# Patient Record
Sex: Male | Born: 1940 | Race: White | Hispanic: No | Smoking: Current every day smoker
Health system: Southern US, Community
[De-identification: ages and names within clinical notes are randomized; demographics above are authoritative.]

## PROBLEM LIST (undated history)

## (undated) DIAGNOSIS — F0280 Dementia in other diseases classified elsewhere without behavioral disturbance: Secondary | ICD-10-CM

## (undated) DIAGNOSIS — I509 Heart failure, unspecified: Secondary | ICD-10-CM

## (undated) DIAGNOSIS — I10 Essential (primary) hypertension: Secondary | ICD-10-CM

## (undated) DIAGNOSIS — G309 Alzheimer's disease, unspecified: Secondary | ICD-10-CM

## (undated) HISTORY — PX: FEMUR SURGERY: SHX943

## (undated) HISTORY — PX: REPLACEMENT TOTAL KNEE: SUR1224

---

## 2006-04-24 ENCOUNTER — Ambulatory Visit (HOSPITAL_BASED_OUTPATIENT_CLINIC_OR_DEPARTMENT_OTHER): Admission: RE | Admit: 2006-04-24 | Discharge: 2006-04-24 | Payer: Self-pay | Admitting: Orthopedic Surgery

## 2009-11-23 ENCOUNTER — Encounter
Admission: RE | Admit: 2009-11-23 | Discharge: 2010-02-08 | Payer: Self-pay | Source: Home / Self Care | Attending: Specialist | Admitting: Specialist

## 2010-06-29 NOTE — Op Note (Signed)
NAMEJOHN, WILLIAMSEN              ACCOUNT NO.:  [id number]   MEDICAL RECORD NO.:  [id number]          PATIENT TYPE:  AMB   LOCATION:  DSC                          FACILITY:  [facility]   PHYSICIAN:  Katy Fitch. Sypher, M.D. 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   POSTOPERATIVE DIAGNOSIS:  Entrapment neuropathy median nerve right  carpal tunnel.   OPERATION:  Release right transverse carpal ligament.   SURGEON:  Katy Fitch. Sypher, M.D.   ASSISTANT:  Marveen Reeks. Dasnoit, P.A.-C.   ANESTHESIA:  General by LMA.   SUPERVISING ANESTHESIOLOGIST:  Janetta Hora. Gelene Mink, M.D.   INDICATIONS:  Brian Frazier is a 70 year old gentleman referred for  evaluation and management of hand numbness.  Clinical examination  revealed signs of severe right carpal tunnel syndrome.  He had had  numbness and pain for several years.  He was referred for  electrodiagnostic studies that documented severe right carpal tunnel  syndrome.  Due to a failure to respond to nonoperative measures, he is  brought to the operating room at this time for release of his right  transverse carpal ligament.   PROCEDURE:  Brian Frazier was brought to the operating room and placed in  the supine position on the operating table.  Following the induction of  general anesthesia by LMA technique, the right arm was prepped with  Betadine soap solution and sterilely draped.  A pneumatic tourniquet was  applied to the proximal right brachium.  Following exsanguination of the  right arm with an Esmarch bandage, the arterial tourniquet was inflated  to 250 mmHg.   The procedure then commenced with a short incision in line with the ring  finger in the palm.  The subcutaneous tissues were carefully divided  revealing the palmar fascia.  This was split longitudinally to  reveal  the common sensory branch of the median nerve.  These were followed back  to the transverse carpal ligament which was gently isolated from the  median nerve with a Insurance risk surveyor.  The ligament was then released  along its ulnar border with scissors extending into the distal forearm.  This widely opened the carpal canal.  No mass or other predicaments were  noted.  Bleeding points along the margin of the released ligament were  cauterized with bipolar current  followed by repair of the skin with intradermal 3-0 Prolene suture.  A  compressive dressing was applied with a volar plaster splint maintaining  the wrist in 5 degrees of dorsiflexion.  There were no apparent  complications.   [name] tolerated the surgery and anesthesia well.  He was  transferred to the recovery room with stable vital signs.      Katy Fitch Sypher, M.D.  Electronically Signed     RVS/MEDQ  D:  04/24/2006  T:  04/25/2006  Job:  [id number]

## 2012-04-23 DIAGNOSIS — R011 Cardiac murmur, unspecified: Secondary | ICD-10-CM

## 2013-10-19 ENCOUNTER — Emergency Department (HOSPITAL_COMMUNITY): Payer: Medicare Other

## 2013-10-19 ENCOUNTER — Encounter (HOSPITAL_COMMUNITY): Payer: Self-pay | Admitting: Emergency Medicine

## 2013-10-19 ENCOUNTER — Emergency Department (HOSPITAL_COMMUNITY)
Admission: EM | Admit: 2013-10-19 | Discharge: 2013-10-19 | Disposition: A | Discharge status: Home / Self Care | Payer: Medicare Other | Attending: Emergency Medicine | Admitting: Emergency Medicine

## 2013-10-19 DIAGNOSIS — M549 Dorsalgia, unspecified: Secondary | ICD-10-CM | POA: Diagnosis present

## 2013-10-19 DIAGNOSIS — I10 Essential (primary) hypertension: Secondary | ICD-10-CM | POA: Insufficient documentation

## 2013-10-19 DIAGNOSIS — I509 Heart failure, unspecified: Secondary | ICD-10-CM | POA: Insufficient documentation

## 2013-10-19 DIAGNOSIS — F0280 Dementia in other diseases classified elsewhere without behavioral disturbance: Secondary | ICD-10-CM | POA: Insufficient documentation

## 2013-10-19 DIAGNOSIS — Z79899 Other long term (current) drug therapy: Secondary | ICD-10-CM | POA: Diagnosis not present

## 2013-10-19 DIAGNOSIS — G309 Alzheimer's disease, unspecified: Secondary | ICD-10-CM | POA: Insufficient documentation

## 2013-10-19 DIAGNOSIS — M5430 Sciatica, unspecified side: Secondary | ICD-10-CM | POA: Insufficient documentation

## 2013-10-19 DIAGNOSIS — Z791 Long term (current) use of non-steroidal anti-inflammatories (NSAID): Secondary | ICD-10-CM | POA: Diagnosis not present

## 2013-10-19 DIAGNOSIS — F17200 Nicotine dependence, unspecified, uncomplicated: Secondary | ICD-10-CM | POA: Diagnosis not present

## 2013-10-19 DIAGNOSIS — M79609 Pain in unspecified limb: Secondary | ICD-10-CM | POA: Insufficient documentation

## 2013-10-19 DIAGNOSIS — M5441 Lumbago with sciatica, right side: Secondary | ICD-10-CM

## 2013-10-19 HISTORY — DX: Heart failure, unspecified: I50.9

## 2013-10-19 HISTORY — DX: Alzheimer's disease, unspecified: G30.9

## 2013-10-19 HISTORY — DX: Essential (primary) hypertension: I10

## 2013-10-19 HISTORY — DX: Dementia in other diseases classified elsewhere, unspecified severity, without behavioral disturbance, psychotic disturbance, mood disturbance, and anxiety: F02.80

## 2013-10-19 MED ORDER — PREDNISONE 20 MG PO TABS: 20.0000 mg | ORAL_TABLET | Freq: Two times a day (BID) | ORAL | Status: DC

## 2013-10-19 NOTE — ED Provider Notes (Signed)
CSN: [id number]     Arrival date & time 10/19/13  4098 History   First MD Initiated Contact with Patient 10/19/13 1152     Chief Complaint  Patient presents with  . Back Pain  . Leg Pain     (Consider location/radiation/quality/duration/timing/severity/associated sxs/prior Treatment) Patient is a 73 y.o. male presenting with back pain and leg pain. The history is provided by the patient.  Back Pain Associated symptoms: leg pain   Leg Pain Associated symptoms: back pain    Patient presents to the emergency department today because he is having back pain radiating to his right leg. He called his primary care doctor this morning, and was advised to come to the ED, because, they thought he needed an MRI, and they thought that we had an Open MRI. The patient had seen in urgent care, 4 days ago, and was advised to continue his regular medication. He has history of arthritis, and chronic pain in both legs. He has been taking his usual pain medication, with relief. He denies urinary incontinence, urinary frequency, difficulty urinating, fecal incontinence, or new paresthesia. He has not had a fever, chills, nausea or vomiting. No other known modifying factors.  Past Medical History  Diagnosis Date  . Hypertension   . CHF (congestive heart failure)   . Alzheimer disease    Past Surgical History  Procedure Laterality Date  . Replacement total knee      right  . Femur surgery      metal rod right leg   No family history on file. History  Substance Use Topics  . Smoking status: Current Every Day Smoker  . Smokeless tobacco: Not on file  . Alcohol Use: Yes    Review of Systems  Musculoskeletal: Positive for back pain.  All other systems reviewed and are negative.     Allergies  Aspirin  Home Medications   Prior to Admission medications   Medication Sig Start Date End Date Taking? Authorizing Provider  atorvastatin (LIPITOR) 10 MG tablet Take 10 mg by mouth at bedtime. 09/28/13   Yes Historical Provider, MD  diazepam (VALIUM) 10 MG tablet Take 10 mg by mouth 2 (two) times daily as needed for anxiety or sleep.  10/07/13  Yes Historical Provider, MD  diclofenac (VOLTAREN) 75 MG EC tablet Take 75 mg by mouth 2 (two) times daily.   Yes Historical Provider, MD  diclofenac sodium (VOLTAREN) 1 % GEL Apply 2 g topically 4 (four) times daily as needed (for knee pain). Do not use if taking diclofenac tablets   Yes Historical Provider, MD  lisinopril-hydrochlorothiazide (PRINZIDE,ZESTORETIC) 10-12.5 MG per tablet Take 1 tablet by mouth at bedtime. 08/23/13  Yes Historical Provider, MD  Oxycodone HCl 20 MG TABS Take 20 mg by mouth every 4 (four) hours as needed (for pain).  09/28/13  Yes Historical Provider, MD  predniSONE (DELTASONE) 20 MG tablet Take 1 tablet (20 mg total) by mouth 2 (two) times daily. 10/19/13   Flint Melter, MD   BP 130/68  Pulse 59  Temp(Src) 98.4 F (36.9 C) (Oral)  Resp 18  SpO2 97% Physical Exam  Nursing note and vitals reviewed. Constitutional: He is oriented to person, place, and time. He appears well-developed and well-nourished.  HENT:  Head: Normocephalic and atraumatic.  Right Ear: External ear normal.  Left Ear: External ear normal.  Eyes: Conjunctivae and EOM are normal. Pupils are equal, round, and reactive to light.  Neck: Normal range of motion and phonation normal. Neck  supple.  Cardiovascular: Normal rate, regular rhythm, normal heart sounds and intact distal pulses.   Pulmonary/Chest: Effort normal and breath sounds normal. He exhibits no bony tenderness.  Abdominal: Soft. There is no tenderness.  Musculoskeletal: Normal range of motion.  Mild lumbar tenderness to palpation. He is able to sit up in the stretcher and hold that position.   Neurological: He is alert and oriented to person, place, and time. No cranial nerve deficit or sensory deficit. He exhibits normal muscle tone. Coordination normal.  Skin: Skin is warm, dry and intact.   Psychiatric: He has a normal mood and affect. His behavior is normal. Judgment and thought content normal.    ED Course  Procedures (including critical care time)  Medications - No data to display  Patient Vitals for the past 24 hrs:  BP Temp Temp src Pulse Resp SpO2  10/19/13 1415 130/68 mmHg - - 59 - 97 %  10/19/13 1403 124/69 mmHg - - 62 18 97 %  10/19/13 1322 139/79 mmHg - - 56 18 97 %  10/19/13 1300 107/66 mmHg - - 57 - 97 %  10/19/13 1230 117/98 mmHg - - 61 - 96 %  10/19/13 1215 102/60 mmHg - - 58 - 96 %  10/19/13 1019 104/64 mmHg 98.4 F (36.9 C) Oral 64 18 95 %    2:58 PM Reevaluation with update and discussion. After initial assessment and treatment, an updated evaluation reveals he is comfortable. Findings discussed with patient and family member, all questions answered. Jamari Moten L   Labs Review Labs Reviewed - No data to display  Imaging Review Dg Lumbar Spine Complete  10/19/2013   CLINICAL DATA:  Back pain without injury  EXAM: LUMBAR SPINE - COMPLETE 4+ VIEW  COMPARISON:  None.  FINDINGS: Five lumbar type vertebral bodies are well visualized. The fifth lumbar vertebra is partially sacralized. Vertebral body height is well maintained. Mild disc space narrowing is noted at L4-5 and L5-S1. Anterior osteophytes are noted particularly at L3-4. Facet hypertrophic changes are noted at all levels.  IMPRESSION: Degenerative change without acute abnormality.   Electronically Signed   By: Alcide Clever M.D.   On: 10/19/2013 13:59     EKG Interpretation None      MDM   Final diagnoses:  Right-sided low back pain with right-sided sciatica    Nonspecific low back pain. Doubt cauda equina syndrome, significant herniated disc or lumbar radiculopathy. His exam is most consistent with sciatica and mild degenerative joint disease.  Nursing Notes Reviewed/ Care Coordinated Applicable Imaging Reviewed Interpretation of Laboratory Data incorporated into ED treatment  The  patient appears reasonably screened and/or stabilized for discharge and I doubt any other medical condition or other [facility] requiring further screening, evaluation, or treatment in the ED at this time prior to discharge.  Plan: Home Medications- Prednisone; Home Treatments- rest, heat; return here if the recommended treatment, does not improve the symptoms; Recommended follow up- PCP prn    Flint Melter, MD 10/19/13 1501

## 2013-10-19 NOTE — ED Notes (Signed)
Room cleaned prior to patient signing for papers. Patient verbalized understanding of discharge instructions.

## 2013-10-19 NOTE — Discharge Instructions (Signed)
Use heat on the sore area 3 or 4 times a day. Do not take the diclofenac tablet, while you were taking prednisone.      Back Pain, Adult Back pain is very common. The pain often gets better over time. The cause of back pain is usually not dangerous. Most people can learn to manage their back pain on their own.  HOME CARE   Stay active. Start with short walks on flat ground if you can. Try to walk farther each day.  Do not sit, drive, or stand in one place for more than 30 minutes. Do not stay in bed.  Do not avoid exercise or work. Activity can help your back heal faster.  Be careful when you bend or lift an object. Bend at your knees, keep the object close to you, and do not twist.  Sleep on a firm mattress. Lie on your side, and bend your knees. If you lie on your back, put a pillow under your knees.  Only take medicines as told by your doctor.  Put ice on the injured area.  Put ice in a plastic bag.  Place a towel between your skin and the bag.  Leave the ice on for 15-20 minutes, 03-04 times a day for the first 2 to 3 days. After that, you can switch between ice and heat packs.  Ask your doctor about back exercises or massage.  Avoid feeling anxious or stressed. Find good ways to deal with stress, such as exercise. GET HELP RIGHT AWAY IF:   Your pain does not go away with rest or medicine.  Your pain does not go away in 1 week.  You have new problems.  You do not feel well.  The pain spreads into your legs.  You cannot control when you poop (bowel movement) or pee (urinate).  Your arms or legs feel weak or lose feeling (numbness).  You feel sick to your stomach (nauseous) or throw up (vomit).  You have belly (abdominal) pain.  You feel like you may pass out (faint). MAKE SURE YOU:   Understand these instructions.  Will watch your condition.  Will get help right away if you are not doing well or get worse. Document Released: 07/17/2007 Document  Revised: 04/22/2011 Document Reviewed: 06/01/2013 [facility] Patient Information 2015 [place], [place]. This information is not intended to replace advice given to you by your health care provider. Make sure you discuss any questions you have with your health care provider.

## 2013-10-19 NOTE — ED Notes (Signed)
Pt was sent here from his MD office for need of possible MRI.  Pt was seen at an urgent care for pain in his right lower back that radiates down right leg.  Pt states pain is not any better, but is currently pain free from taking oxycodone.  Pt denies any abdominal pain

## 2017-05-27 ENCOUNTER — Encounter (INDEPENDENT_AMBULATORY_CARE_PROVIDER_SITE_OTHER): Payer: Self-pay | Admitting: *Deleted

## 2017-06-05 ENCOUNTER — Encounter (INDEPENDENT_AMBULATORY_CARE_PROVIDER_SITE_OTHER): Payer: Self-pay | Admitting: *Deleted

## 2020-06-29 ENCOUNTER — Encounter: Payer: Self-pay | Admitting: Neurology

## 2020-08-17 NOTE — Progress Notes (Signed)
NEUROLOGY CONSULTATION NOTE  [name] MRN: [id number] DOB: May 27, 1940  Referring provider: Halford Decamp IV, FNP Primary care provider: Samuel Jester, DO  Reason for consult:  migraine  Assessment/Plan:   Chronic daily headaches, may be cervicogenic component, unclear if actually migraine - no history and does not meet criteria - may be medication-overuse component.  1.MRI brain with and without contrast 2.Start gabapentin 100mg  twice daily.  We can increase to 300mg  twice daily in 4 weeks if needed. 3.Limit use of pain relievers to no more than 2 days out of week to prevent risk of rebound or medication-overuse headache. 4.  Caffeine cessation 5. Follow up 6 months.   Subjective:  Brian Frazier is a 80 year old male with CHF, HTN and Alzheimer's disease who presents for migraine.  History supplemented by referring provider's note.  Onset:  a little over a year ago Location:  started in right ear and now top of head and bilateral/left sided and down neck bilaterally Quality:  throbbing, stabbing in necks Intensity:  severe.   Aura:  no Prodrome:  no Associated symptoms:  Photophobia.  No nausea, vomiting, phonophobia, visual disturbance.   Duration:  all day Frequency:  daily Frequency of abortive medication: indomethacin daily (arthritis), ibuprofen at least 2 days a week. Triggers:  unknown Relieving factors:  Nurtec Activity:  aggravates  Current NSAIDS/analgesics:  ibuprofen, indomethacin 50mg  BID Current triptans:  none Current ergotamine:  none Current anti-emetic:  none Current muscle relaxants:  none Current Antihypertensive medications:  lisinopril-HCTZ Current Antidepressant medications:  Lexapro 10mg  daily Current Anticonvulsant medications:  none Current anti-CGRP:  Nurtec  Past NSAIDS/analgesics:  none Past abortive triptans:  none Past abortive ergotamine:  none Past muscle relaxants:  none Past anti-emetic:  none Past  antihypertensive medications:  none Past antidepressant medications:  none Past anticonvulsant medications:  gabapentin 100mg  BID (not for years) Past anti-CGRP:  none Past vitamins/Herbal/Supplements:  none Past antihistamines/decongestants:  none Other past therapies:  none  Caffeine:  1 to 2 cups coffee daily Family history of headache:  history of brain cancer     PAST MEDICAL HISTORY: Past Medical History:  Diagnosis Date   Alzheimer disease (HCC)    CHF (congestive heart failure) (HCC)    Hypertension     PAST SURGICAL HISTORY: Past Surgical History:  Procedure Laterality Date   FEMUR SURGERY     metal rod right leg   REPLACEMENT TOTAL KNEE     right    MEDICATIONS: Current Outpatient Medications on File Prior to Visit  Medication Sig Dispense Refill   atorvastatin (LIPITOR) 10 MG tablet Take 10 mg by mouth at bedtime.     diazepam (VALIUM) 10 MG tablet Take 10 mg by mouth 2 (two) times daily as needed for anxiety or sleep.      diclofenac (VOLTAREN) 75 MG EC tablet Take 75 mg by mouth 2 (two) times daily.     diclofenac sodium (VOLTAREN) 1 % GEL Apply 2 g topically 4 (four) times daily as needed (for knee pain). Do not use if taking diclofenac tablets     lisinopril-hydrochlorothiazide (PRINZIDE,ZESTORETIC) 10-12.5 MG per tablet Take 1 tablet by mouth at bedtime.     Oxycodone HCl 20 MG TABS Take 20 mg by mouth every 4 (four) hours as needed (for pain).      predniSONE (DELTASONE) 20 MG tablet Take 1 tablet (20 mg total) by mouth 2 (two) times daily. 10 tablet 0   No current facility-administered medications  on file prior to visit.    ALLERGIES: Allergies  Allergen Reactions   Aspirin Anaphylaxis, Hives and Swelling    FAMILY HISTORY: No family history on file.  Objective:  Blood pressure 117/75, pulse 72, height 5' 10.5" (1.791 m), weight 252 lb 4 oz (114.4 kg), SpO2 92 %. General: No acute distress.  Patient appears well-groomed.   Head:   Normocephalic/atraumatic Eyes:  fundi examined but not visualized Neck: supple, bilateral paraspinal tenderness, full range of motion Back: No paraspinal tenderness Heart: regular rate and rhythm Lungs: Clear to auscultation bilaterally. Vascular: No carotid bruits. Neurological Exam: Mental status: alert and oriented to person, place, and time, recent and remote memory intact, fund of knowledge intact, attention and concentration intact, speech fluent and not dysarthric, language intact. Cranial nerves: CN I: not tested CN II: pupils equal, round and reactive to light, visual fields intact CN III, IV, VI:  full range of motion, no nystagmus, no ptosis CN V: facial sensation intact. CN VII: upper and lower face symmetric CN VIII: hearing intact CN IX, X: gag intact, uvula midline CN XI: sternocleidomastoid and trapezius muscles intact CN XII: tongue midline Bulk & Tone: normal, no fasciculations. Motor:  muscle strength 5/5 throughout.  Postural and kinetic tremor in hands. Sensation:  temperature and vibratory sensation reduced in feet Deep Tendon Reflexes:  absent throughout,  toes downgoing.   Finger to nose testing:  Without dysmetria.   Heel to shin:  Without dysmetria.   Gait:  Normal station and stride.  Romberg negative.    Thank you for allowing me to take part in the care of this patient.  Shon Millet, DO

## 2020-08-21 ENCOUNTER — Encounter: Payer: Self-pay | Admitting: Neurology

## 2020-08-21 ENCOUNTER — Other Ambulatory Visit: Payer: Self-pay

## 2020-08-21 ENCOUNTER — Ambulatory Visit (INDEPENDENT_AMBULATORY_CARE_PROVIDER_SITE_OTHER): Payer: Medicare Other | Admitting: Neurology

## 2020-08-21 VITALS — BP 117/75 | HR 72 | Ht 70.5 in | Wt 252.2 lb

## 2020-08-21 DIAGNOSIS — R519 Headache, unspecified: Secondary | ICD-10-CM

## 2020-08-21 MED ORDER — GABAPENTIN 100 MG PO CAPS: 100.0000 mg | ORAL_CAPSULE | Freq: Two times a day (BID) | ORAL | 5 refills | Status: AC

## 2020-08-21 NOTE — Patient Instructions (Signed)
Start gabapentin 100mg  - take 1 pill twice daily.  If no improvement in 4 weeks, contact me and we can increase dose Limit use of pain relievers to no more than 2 days out of week to prevent risk of rebound or medication-overuse headache. Stop coffee intake Check MRI of brain with and without contrast

## 2020-08-21 NOTE — Addendum Note (Signed)
Addended by: Tawnya Crook on: 08/21/2020 09:36 AM   Modules accepted: Orders

## 2020-08-23 ENCOUNTER — Other Ambulatory Visit: Payer: Self-pay

## 2020-08-23 ENCOUNTER — Ambulatory Visit
Admission: RE | Admit: 2020-08-23 | Discharge: 2020-08-23 | Disposition: A | Discharge status: Home / Self Care | Payer: Medicare Other | Source: Ambulatory Visit | Attending: Neurology | Admitting: Neurology

## 2020-08-23 DIAGNOSIS — R519 Headache, unspecified: Secondary | ICD-10-CM

## 2020-08-23 MED ORDER — GADOBENATE DIMEGLUMINE 529 MG/ML IV SOLN
20.0000 mL | Freq: Once | INTRAVENOUS | Status: AC | PRN
  Administered 2020-08-23: 20 mL via INTRAVENOUS

## 2021-01-09 ENCOUNTER — Other Ambulatory Visit: Payer: Self-pay

## 2021-01-09 ENCOUNTER — Ambulatory Visit (INDEPENDENT_AMBULATORY_CARE_PROVIDER_SITE_OTHER): Payer: Medicare Other | Admitting: Urology

## 2021-01-09 ENCOUNTER — Encounter: Payer: Self-pay | Admitting: Urology

## 2021-01-09 VITALS — BP 129/77 | HR 73 | Wt 252.0 lb

## 2021-01-09 DIAGNOSIS — R3129 Other microscopic hematuria: Secondary | ICD-10-CM | POA: Diagnosis not present

## 2021-01-09 DIAGNOSIS — N3941 Urge incontinence: Secondary | ICD-10-CM

## 2021-01-09 LAB — URINALYSIS, ROUTINE W REFLEX MICROSCOPIC
Bilirubin, UA: NEGATIVE
Glucose, UA: NEGATIVE
Ketones, UA: NEGATIVE
Leukocytes,UA: NEGATIVE
Nitrite, UA: NEGATIVE
Protein,UA: NEGATIVE
Specific Gravity, UA: 1.02 (ref 1.005–1.030)
Urobilinogen, Ur: 1 mg/dL (ref 0.2–1.0)
pH, UA: 7 (ref 5.0–7.5)

## 2021-01-09 LAB — MICROSCOPIC EXAMINATION
Bacteria, UA: NONE SEEN
Renal Epithel, UA: NONE SEEN /hpf
WBC, UA: NONE SEEN /hpf (ref 0–5)

## 2021-01-09 MED ORDER — MIRABEGRON ER 50 MG PO TB24: 50.0000 mg | ORAL_TABLET | Freq: Every day | ORAL | 0 refills | Status: DC

## 2021-01-09 NOTE — Progress Notes (Signed)
Assessment: 1. Urge incontinence   2. Microscopic hematuria    Plan: Trial of Myrbetriq 25 mg daily x 2 weeks, then 50 mg daily x 2 weeks.  Samples given. Recheck U/A next visit - discussed further evaluation of hematuria if microscopic hematuria persists Return to office in 1 month  Chief Complaint:  Chief Complaint  Patient presents with   Urinary Incontinence    History of Present Illness:  Brian Frazier is a 80 y.o. year old male who is seen in consultation from Loletha Grayer, [place] for evaluation of urinary incontinence.  He reports symptoms for approximately 1 year.  He has urinary frequency, voiding every 1-2 hours, urgency, and nocturia 4-5 times.  He does have urge incontinence.  No difficulty starting his stream.  He feels like he empties completely.  No dysuria or gross hematuria.  No history of UTIs or prostatitis.  He received some samples of the medication from his primary care provider which he took for 2 weeks and noted improvement in his symptoms.  He is not aware of the name of the medication.   Past Medical History:  Past Medical History:  Diagnosis Date   Alzheimer disease ([place])    CHF (congestive heart failure) ([place])    Hypertension     Past Surgical History:  Past Surgical History:  Procedure Laterality Date   FEMUR SURGERY     metal rod right leg   REPLACEMENT TOTAL KNEE     right    Allergies:  Allergies  Allergen Reactions   Aspirin Anaphylaxis, Hives and Swelling    Family History:  No family history on file.  Social History:  Social History   Tobacco Use   Smoking status: Every Day   Smokeless tobacco: Never  Substance Use Topics   Alcohol use: Yes   Drug use: No    Review of symptoms:  Constitutional:  Negative for unexplained weight loss, night sweats, fever, chills ENT:  Negative for nose bleeds, sinus pain, painful swallowing CV:  Negative for chest pain, shortness of breath, exercise intolerance, palpitations,  loss of consciousness Resp:  Negative for cough, wheezing, shortness of breath GI:  Negative for nausea, vomiting, diarrhea, bloody stools GU:  Positives noted in HPI; otherwise negative for gross hematuria, dysuria Neuro:  Negative for seizures, poor balance, limb weakness, slurred speech Psych:  Negative for lack of energy, depression, anxiety Endocrine:  Negative for polydipsia, polyuria, symptoms of hypoglycemia (dizziness, hunger, sweating) Hematologic:  Negative for anemia, purpura, petechia, prolonged or excessive bleeding, use of anticoagulants  Allergic:  Negative for difficulty breathing or choking as a result of exposure to anything; no shellfish allergy; no allergic response (rash/itch) to materials, foods  Physical exam: BP 129/77   Pulse 73   Wt 252 lb (114.3 kg)   BMI 35.65 kg/m  GENERAL APPEARANCE:  Well appearing, well developed, well nourished, NAD HEENT: Atraumatic, Normocephalic, oropharynx clear. NECK: Supple without lymphadenopathy or thyromegaly. LUNGS: Clear to auscultation bilaterally. HEART: Regular Rate and Rhythm without murmurs, gallops, or rubs. ABDOMEN: Soft, non-tender, No Masses. EXTREMITIES: Moves all extremities well.  Without clubbing, cyanosis, or edema. NEUROLOGIC:  Alert and oriented x 3, normal gait, CN II-XII grossly intact.  MENTAL STATUS:  Appropriate. BACK:  Non-tender to palpation.  No CVAT SKIN:  Warm, dry and intact.   GU: Penis:  uncircumcised Meatus: Normal Scrotum: normal, no masses Testis: normal without masses bilateral Epididymis: normal Prostate: 40 g, NT, no nodules Rectum: Normal tone,  no masses  or tenderness   Results: U/A:  3-10 RBCs  PVR:  0 ml

## 2021-01-09 NOTE — Addendum Note (Signed)
Addended by: Bonnita Levan on: 01/09/2021 10:55 AM   Modules accepted: Orders

## 2021-01-09 NOTE — Progress Notes (Signed)
Urological Symptom Review  Patient is experiencing the following symptoms: Frequent urination Hard to postpone urination Get up at night to urinate Leakage of urine   Review of Systems  Gastrointestinal (upper)  : Negative for upper GI symptoms  Gastrointestinal (lower) : Constipation  Constitutional : Negative for symptoms  Skin: Itching  Eyes: Blurred vision  Ear/Nose/Throat : Sinus problems  Hematologic/Lymphatic: Negative for Hematologic/Lymphatic symptoms  Cardiovascular : Leg swelling  Respiratory : Negative for respiratory symptoms  Endocrine: Negative for endocrine symptoms  Musculoskeletal: Joint pain  Neurological: Negative for neurological symptoms  Psychologic: Negative for psychiatric symptoms

## 2021-02-09 ENCOUNTER — Ambulatory Visit (INDEPENDENT_AMBULATORY_CARE_PROVIDER_SITE_OTHER): Payer: Medicare Other | Admitting: Urology

## 2021-02-09 ENCOUNTER — Encounter: Payer: Self-pay | Admitting: Urology

## 2021-02-09 ENCOUNTER — Other Ambulatory Visit: Payer: Self-pay

## 2021-02-09 VITALS — BP 119/69 | HR 71 | Wt 252.0 lb

## 2021-02-09 DIAGNOSIS — N3941 Urge incontinence: Secondary | ICD-10-CM

## 2021-02-09 LAB — URINALYSIS, ROUTINE W REFLEX MICROSCOPIC
Bilirubin, UA: NEGATIVE
Glucose, UA: NEGATIVE
Ketones, UA: NEGATIVE
Leukocytes,UA: NEGATIVE
Nitrite, UA: NEGATIVE
Protein,UA: NEGATIVE
Specific Gravity, UA: 1.02 (ref 1.005–1.030)
Urobilinogen, Ur: 1 mg/dL (ref 0.2–1.0)
pH, UA: 7 (ref 5.0–7.5)

## 2021-02-09 LAB — MICROSCOPIC EXAMINATION
Bacteria, UA: NONE SEEN
Epithelial Cells (non renal): NONE SEEN /hpf (ref 0–10)
RBC: NONE SEEN /hpf (ref 0–2)
Renal Epithel, UA: NONE SEEN /hpf
WBC, UA: NONE SEEN /hpf (ref 0–5)

## 2021-02-09 MED ORDER — MIRABEGRON ER 50 MG PO TB24: 50.0000 mg | ORAL_TABLET | Freq: Every day | ORAL | 11 refills | Status: DC

## 2021-02-09 NOTE — Progress Notes (Signed)
° °  Assessment: 1. Urge incontinence     Plan: Continue Myrbetriq 50 mg daily.  Samples and prescription provided. Continue tamsulosin. Return to office in 3 months.  Chief Complaint:  Chief Complaint  Patient presents with   Urinary Incontinence    History of Present Illness:  Brian Frazier is a 80 y.o. year old male who is seen for further evaluation of urinary incontinence.  At his initial visit in 11/22, he reported symptoms for approximately 1 year with urinary frequency, voiding every 1-2 hours, urgency, and nocturia 4-5 times.  He also reported urge incontinence.  No difficulty starting his stream.  He felt like he emptied completely.  No dysuria or gross hematuria.  No history of UTIs or prostatitis.  He received some samples of a medication from his primary care provider which he took for 2 weeks and noted improvement in his symptoms.  He is not aware of the name of the medication. PVR = 0 ml U/A:  3-10 RBC He was given a trial of Myrbetriq 25 mg daily x2 weeks then increasing to 50 mg daily x2 weeks at his visit in 11/22.  He returns today for follow-up.  He continues on tamsulosin and Myrbetriq 50 mg daily.  He has noted improvement in his lower urinary tract symptoms with decreased frequency, urgency, and nocturia.  He also has fewer episodes of urge incontinence.  No side effects from the medication.  He feels like he is emptying his bladder well.  No dysuria or gross hematuria.  Portions of the above documentation were copied from a prior visit for review purposes only.   Past Medical History:  Past Medical History:  Diagnosis Date   Alzheimer disease (HCC)    CHF (congestive heart failure) (HCC)    Hypertension     Past Surgical History:  Past Surgical History:  Procedure Laterality Date   FEMUR SURGERY     metal rod right leg   REPLACEMENT TOTAL KNEE     right    Allergies:  Allergies  Allergen Reactions   Aspirin Anaphylaxis, Hives and Swelling     Family History:  History reviewed. No pertinent family history.  Social History:  Social History   Tobacco Use   Smoking status: Every Day   Smokeless tobacco: Never  Substance Use Topics   Alcohol use: Yes   Drug use: No    ROS: Constitutional:  Negative for fever, chills, weight loss CV: Negative for chest pain, previous MI, hypertension Respiratory:  Negative for shortness of breath, wheezing, sleep apnea, frequent cough GI:  Negative for nausea, vomiting, bloody stool, GERD  Physical exam: BP 119/69    Pulse 71    Wt 252 lb (114.3 kg)    BMI 35.65 kg/m  GENERAL APPEARANCE:  Well appearing, well developed, well nourished, NAD HEENT:  Atraumatic, normocephalic, oropharynx clear NECK:  Supple without lymphadenopathy or thyromegaly ABDOMEN:  Soft, non-tender, no masses EXTREMITIES:  Moves all extremities well, without clubbing, cyanosis, or edema NEUROLOGIC:  Alert and oriented x 3, normal gait, CN II-XII grossly intact MENTAL STATUS:  appropriate BACK:  Non-tender to palpation, No CVAT SKIN:  Warm, dry, and intact   Results: U/A:  trace blood, 0-2 RBCs

## 2021-03-05 ENCOUNTER — Ambulatory Visit: Payer: Medicare Other | Admitting: Neurology

## 2021-05-08 ENCOUNTER — Ambulatory Visit (INDEPENDENT_AMBULATORY_CARE_PROVIDER_SITE_OTHER): Payer: Medicare Other | Admitting: Urology

## 2021-05-08 ENCOUNTER — Encounter: Payer: Self-pay | Admitting: Urology

## 2021-05-08 ENCOUNTER — Other Ambulatory Visit: Payer: Self-pay

## 2021-05-08 VITALS — BP 125/74 | HR 66

## 2021-05-08 DIAGNOSIS — R3129 Other microscopic hematuria: Secondary | ICD-10-CM | POA: Diagnosis not present

## 2021-05-08 DIAGNOSIS — N3941 Urge incontinence: Secondary | ICD-10-CM | POA: Diagnosis not present

## 2021-05-08 LAB — BLADDER SCAN AMB NON-IMAGING: Scan Result: 0

## 2021-05-08 NOTE — Progress Notes (Deleted)
? ?  Assessment: ?1. Urge incontinence   ?2. Microscopic hematuria   ? ? ?Plan: ?Continue Myrbetriq 50 mg daily.  Samples and prescription provided. ?Continue tamsulosin. ?Return to office in 3 months. ? ?Chief Complaint:  ?No chief complaint on file. ? ? ?History of Present Illness: ? ?Brian Frazier is a 81 y.o. year old male who is seen for further evaluation of urinary incontinence.  At his initial visit in 11/22, he reported symptoms for approximately 1 year with urinary frequency, voiding every 1-2 hours, urgency, and nocturia 4-5 times.  He also reported urge incontinence.  No difficulty starting his stream.  He felt like he emptied completely.  No dysuria or gross hematuria.  No history of UTIs or prostatitis.  He received some samples of a medication from his primary care provider which he took for 2 weeks and noted improvement in his symptoms.  He is not aware of the name of the medication. ?PVR = 0 ml ?U/A:  3-10 RBC ?He was given a trial of Myrbetriq 25 mg daily x2 weeks then increasing to 50 mg daily x2 weeks at his visit in 11/22. ? ?At his visit on 02/09/21, he continued on tamsulosin and Myrbetriq 50 mg daily with noted improvement in his lower urinary tract symptoms with decreased frequency, urgency, and nocturia.  He also had fewer episodes of urge incontinence.  No side effects from the medication.  He was emptying his bladder well.  No dysuria or gross hematuria. ? ?Portions of the above documentation were copied from a prior visit for review purposes only. ? ? ?Past Medical History:  ?Past Medical History:  ?Diagnosis Date  ? Alzheimer disease (HCC)   ? CHF (congestive heart failure) (HCC)   ? Hypertension   ? ? ?Past Surgical History:  ?Past Surgical History:  ?Procedure Laterality Date  ? FEMUR SURGERY    ? metal rod right leg  ? REPLACEMENT TOTAL KNEE    ? right  ? ? ?Allergies:  ?Allergies  ?Allergen Reactions  ? Aspirin Anaphylaxis, Hives and Swelling  ? ? ?Family History:  ?No family  history on file. ? ?Social History:  ?Social History  ? ?Tobacco Use  ? Smoking status: Every Day  ? Smokeless tobacco: Never  ?Substance Use Topics  ? Alcohol use: Yes  ? Drug use: No  ? ? ?ROS: ?Constitutional:  Negative for fever, chills, weight loss ?CV: Negative for chest pain, previous MI, hypertension ?Respiratory:  Negative for shortness of breath, wheezing, sleep apnea, frequent cough ?GI:  Negative for nausea, vomiting, bloody stool, GERD ? ? ?Physical exam: ?There were no vitals taken for this visit. ?GENERAL APPEARANCE:  Well appearing, well developed, well nourished, NAD ?HEENT:  Atraumatic, normocephalic, oropharynx clear ?NECK:  Supple without lymphadenopathy or thyromegaly ?ABDOMEN:  Soft, non-tender, no masses ?EXTREMITIES:  Moves all extremities well, without clubbing, cyanosis, or edema ?NEUROLOGIC:  Alert and oriented x 3, normal gait, CN II-XII grossly intact ?MENTAL STATUS:  appropriate ?BACK:  Non-tender to palpation, No CVAT ?SKIN:  Warm, dry, and intact ? ? ?Results: ?U/A:  ?

## 2021-05-08 NOTE — Progress Notes (Signed)
? ?Assessment: ?1. Urge incontinence   ?2. Microscopic hematuria   ? ? ?Plan: ?Continue Myrbetriq 50 mg daily.   ?Continue tamsulosin 0.4 mg daily. ?He will check his prescriptions when he goes home and let me know if he is taking both of these medications. ?Today I had a discussion with the patient regarding the findings of microscopic hematuria including the implications and differential diagnoses associated with it.  I also discussed recommendations for further evaluation including the rationale for upper tract imaging and cystoscopy.  I discussed the nature of these procedures including potential risk and complications.  The patient expressed an understanding of these issues. ?Schedule for CT hematuria protocol and cystoscopy. ? ? ?Chief Complaint:  ?Chief Complaint  ?Patient presents with  ? Urinary Incontinence  ? ? ?History of Present Illness: ? ?Brian Frazier is a 81 y.o. year old male who is seen for further evaluation of urinary incontinence.  At his initial visit in 11/22, he reported symptoms for approximately 1 year with urinary frequency, voiding every 1-2 hours, urgency, and nocturia 4-5 times.  He also reported urge incontinence.  No difficulty starting his stream.  He felt like he emptied completely.  No dysuria or gross hematuria.  No history of UTIs or prostatitis.  He received some samples of a medication from his primary care provider which he took for 2 weeks and noted improvement in his symptoms.  He is not aware of the name of the medication. ?PVR = 0 ml ?U/A:  3-10 RBC ?He was given a trial of Myrbetriq 25 mg daily x2 weeks then increasing to 50 mg daily x2 weeks at his visit in 11/22. ? ?At his visit on 02/09/21, he continued on tamsulosin and Myrbetriq 50 mg daily with noted improvement in his lower urinary tract symptoms with decreased frequency, urgency, and nocturia.  He also had fewer episodes of urge incontinence.  No side effects from the medication.  He was emptying his  bladder well.  No dysuria or gross hematuria. ? ?He returns today for follow-up.  He reports symptoms of nocturia 3-4 times, urgency, and occasional urge incontinence.  He reports voiding with a good stream.  No dysuria or gross hematuria.  He is not sure if he is taking both tamsulosin and Myrbetriq at the present time. ?IPSS = 10 today. ? ?Portions of the above documentation were copied from a prior visit for review purposes only. ? ? ?Past Medical History:  ?Past Medical History:  ?Diagnosis Date  ? Alzheimer disease (HCC)   ? CHF (congestive heart failure) (HCC)   ? Hypertension   ? ? ?Past Surgical History:  ?Past Surgical History:  ?Procedure Laterality Date  ? FEMUR SURGERY    ? metal rod right leg  ? REPLACEMENT TOTAL KNEE    ? right  ? ? ?Allergies:  ?Allergies  ?Allergen Reactions  ? Aspirin Anaphylaxis, Hives and Swelling  ? ? ?Family History:  ?No family history on file. ? ?Social History:  ?Social History  ? ?Tobacco Use  ? Smoking status: Every Day  ? Smokeless tobacco: Never  ?Substance Use Topics  ? Alcohol use: Yes  ? Drug use: No  ? ? ?ROS: ?Constitutional:  Negative for fever, chills, weight loss ?CV: Negative for chest pain, previous MI, hypertension ?Respiratory:  Negative for shortness of breath, wheezing, sleep apnea, frequent cough ?GI:  Negative for nausea, vomiting, bloody stool, GERD ? ? ?Physical exam: ?BP 125/74   Pulse 66  ?GENERAL APPEARANCE:  Well  appearing, well developed, well nourished, NAD ?HEENT:  Atraumatic, normocephalic, oropharynx clear ?NECK:  Supple without lymphadenopathy or thyromegaly ?ABDOMEN:  Soft, non-tender, no masses ?EXTREMITIES:  Moves all extremities well, without clubbing, cyanosis, or edema ?NEUROLOGIC:  Alert and oriented x 3, normal gait, CN II-XII grossly intact ?MENTAL STATUS:  appropriate ?BACK:  Non-tender to palpation, No CVAT ?SKIN:  Warm, dry, and intact ? ? ?Results: ?U/A: 3-10 RBCs ? ?PVR = 0 ml ?

## 2021-05-08 NOTE — Progress Notes (Signed)
post void residual=0 ?

## 2021-05-09 ENCOUNTER — Telehealth: Payer: Self-pay

## 2021-05-09 LAB — URINALYSIS, ROUTINE W REFLEX MICROSCOPIC
Bilirubin, UA: NEGATIVE
Glucose, UA: NEGATIVE
Ketones, UA: NEGATIVE
Leukocytes,UA: NEGATIVE
Nitrite, UA: NEGATIVE
Protein,UA: NEGATIVE
Specific Gravity, UA: 1.025 (ref 1.005–1.030)
Urobilinogen, Ur: 0.2 mg/dL (ref 0.2–1.0)
pH, UA: 6 (ref 5.0–7.5)

## 2021-05-09 LAB — MICROSCOPIC EXAMINATION: WBC, UA: NONE SEEN /hpf (ref 0–5)

## 2021-05-09 NOTE — Telephone Encounter (Signed)
Patient is asking what two medications he should be increasing? Please advise. ?

## 2021-05-09 NOTE — Telephone Encounter (Signed)
Patient aware.

## 2021-06-07 ENCOUNTER — Encounter: Payer: Self-pay | Admitting: Urology

## 2021-06-07 ENCOUNTER — Ambulatory Visit (INDEPENDENT_AMBULATORY_CARE_PROVIDER_SITE_OTHER): Payer: Medicare Other | Admitting: Urology

## 2021-06-07 VITALS — BP 121/75 | HR 64

## 2021-06-07 DIAGNOSIS — R399 Unspecified symptoms and signs involving the genitourinary system: Secondary | ICD-10-CM

## 2021-06-07 DIAGNOSIS — R3129 Other microscopic hematuria: Secondary | ICD-10-CM | POA: Diagnosis not present

## 2021-06-07 DIAGNOSIS — N3941 Urge incontinence: Secondary | ICD-10-CM

## 2021-06-07 MED ORDER — TAMSULOSIN HCL 0.4 MG PO CAPS: 0.4000 mg | ORAL_CAPSULE | Freq: Every day | ORAL | 11 refills | Status: AC

## 2021-06-07 MED ORDER — CIPROFLOXACIN HCL 500 MG PO TABS
500.0000 mg | ORAL_TABLET | Freq: Once | ORAL | Status: AC
  Administered 2021-06-07: 500 mg via ORAL

## 2021-06-07 NOTE — Progress Notes (Signed)
? ?Assessment: ?1. Microscopic hematuria   ?2. Urge incontinence   ?3. Lower urinary tract symptoms   ? ? ?Plan: ?Proceed with CT hematuria protocol next week for completion of hematuria evaluation ?Continue Myrbetriq 50 mg daily.   ?Continue tamsulosin 0.4 mg daily. ?Cipro x1 following cystoscopy ?Return to office in 2 months. ? ?Chief Complaint:  ?Chief Complaint  ?Patient presents with  ? Hematuria  ? ? ?History of Present Illness: ? ?Brian Frazier is a 81 y.o. year old male who is seen for further evaluation of microscopic hematuria and urinary incontinence.  At his initial visit in 79/22, he reported symptoms for approximately 1 year with urinary frequency, voiding every 1-2 hours, urgency, and nocturia 4-5 times.  He also reported urge incontinence.  No difficulty starting his stream.  He felt like he emptied completely.  No dysuria or gross hematuria.  No history of UTIs or prostatitis.  He received some samples of a medication from his primary care provider which he took for 2 weeks and noted improvement in his symptoms.  He is not aware of the name of the medication. ?PVR = 0 ml ?U/A:  3-10 RBC ?He was given a trial of Myrbetriq 25 mg daily x2 weeks then increasing to 50 mg daily x 2 weeks at his visit in 11/22. ? ?At his visit on 02/09/21, he continued on tamsulosin and Myrbetriq 50 mg daily with noted improvement in his lower urinary tract symptoms with decreased frequency, urgency, and nocturia.  He also had fewer episodes of urge incontinence.  No side effects from the medication.  He was emptying his bladder well.  No dysuria or gross hematuria. ? ?At his visit in March 2023, he reported symptoms of nocturia 3-4 times, urgency, and occasional urge incontinence.  He was voiding with a good stream.  No dysuria or gross hematuria.  He was not sure if he is taking both tamsulosin and Myrbetriq. ?IPSS = 10. ?PVR = 0 mL ? ?He presents today for further evaluation with cystoscopy.  His CT hematuria  protocol is scheduled for 06/13/2021. ?He reports symptoms of decreased stream, incomplete emptying, urgency, and straining.  No dysuria or gross hematuria.  He continues on tamsulosin and Myrbetriq.  He reports that his urinary symptoms are improved. ?IPSS = 27 today.  ? ?Portions of the above documentation were copied from a prior visit for review purposes only. ? ? ?Past Medical History:  ?Past Medical History:  ?Diagnosis Date  ? Alzheimer disease (HCC)   ? CHF (congestive heart failure) (HCC)   ? Hypertension   ? ? ?Past Surgical History:  ?Past Surgical History:  ?Procedure Laterality Date  ? FEMUR SURGERY    ? metal rod right leg  ? REPLACEMENT TOTAL KNEE    ? right  ? ? ?Allergies:  ?Allergies  ?Allergen Reactions  ? Aspirin Anaphylaxis, Hives and Swelling  ? ? ?Family History:  ?No family history on file. ? ?Social History:  ?Social History  ? ?Tobacco Use  ? Smoking status: Every Day  ? Smokeless tobacco: Never  ?Substance Use Topics  ? Alcohol use: Yes  ? Drug use: No  ? ? ?ROS: ?Constitutional:  Negative for fever, chills, weight loss ?CV: Negative for chest pain, previous MI, hypertension ?Respiratory:  Negative for shortness of breath, wheezing, sleep apnea, frequent cough ?GI:  Negative for nausea, vomiting, bloody stool, GERD ? ? ?Physical exam: ?BP 121/75   Pulse 64  ?GENERAL APPEARANCE:  Well appearing, well developed, well  nourished, NAD ?HEENT:  Atraumatic, normocephalic, oropharynx clear ?NECK:  Supple without lymphadenopathy or thyromegaly ?ABDOMEN:  Soft, non-tender, no masses ?EXTREMITIES:  Moves all extremities well, without clubbing, cyanosis, or edema ?NEUROLOGIC:  Alert and oriented x 3, normal gait, CN II-XII grossly intact ?MENTAL STATUS:  appropriate ?BACK:  Non-tender to palpation, No CVAT ?SKIN:  Warm, dry, and intact ? ? ?Results: ?U/A: 3-10 RBCs, few bacteria ? ?Procedure:  Flexible Cystourethroscopy ? ?Pre-operative Diagnosis: Microscopic hematuria ? ?Post-operative Diagnosis:  Microscopic hematuria ? ?Anesthesia:  local with lidocaine jelly ? ?Surgical Narrative: ? ?After appropriate informed consent was obtained, the patient was prepped and draped in the usual sterile fashion in the supine position.  The patient was correctly identified and the proper procedure delineated prior to proceeding.  Sterile lidocaine gel was instilled in the urethra. ?The flexible cystoscope was introduced without difficulty. ? ?Findings: ? ?Anterior urethra: Normal ? ?Posterior urethra:  Minimal enlargement of the prostate ? ?Bladder: Normal ? ?Ureteral orifices: normal ? ?Additional findings: None ? ?Saline bladder wash for cytology was not performed.   ? ?The cystoscope was then removed.  The patient tolerated the procedure well. ? ? ?

## 2021-06-08 LAB — URINALYSIS, ROUTINE W REFLEX MICROSCOPIC
Bilirubin, UA: NEGATIVE
Glucose, UA: NEGATIVE
Ketones, UA: NEGATIVE
Leukocytes,UA: NEGATIVE
Nitrite, UA: NEGATIVE
Protein,UA: NEGATIVE
Specific Gravity, UA: 1.02 (ref 1.005–1.030)
Urobilinogen, Ur: 0.2 mg/dL (ref 0.2–1.0)
pH, UA: 6 (ref 5.0–7.5)

## 2021-06-08 LAB — MICROSCOPIC EXAMINATION
Renal Epithel, UA: NONE SEEN /hpf
WBC, UA: NONE SEEN /hpf (ref 0–5)

## 2021-06-13 ENCOUNTER — Ambulatory Visit (HOSPITAL_COMMUNITY)
Admission: RE | Admit: 2021-06-13 | Discharge: 2021-06-13 | Disposition: A | Discharge status: Home / Self Care | Payer: Medicare Other | Source: Ambulatory Visit | Attending: Urology | Admitting: Urology

## 2021-06-13 DIAGNOSIS — R3129 Other microscopic hematuria: Secondary | ICD-10-CM | POA: Diagnosis not present

## 2021-06-13 MED ORDER — IOHEXOL 300 MG/ML  SOLN
100.0000 mL | Freq: Once | INTRAMUSCULAR | Status: AC | PRN
  Administered 2021-06-13: 100 mL via INTRAVENOUS

## 2021-06-14 NOTE — Progress Notes (Signed)
Notified via letter  

## 2021-08-09 ENCOUNTER — Ambulatory Visit: Payer: Medicare Other | Admitting: Urology

## 2022-01-28 ENCOUNTER — Other Ambulatory Visit: Payer: Self-pay | Admitting: Urology

## 2022-01-28 DIAGNOSIS — N3941 Urge incontinence: Secondary | ICD-10-CM

## 2022-05-27 ENCOUNTER — Other Ambulatory Visit: Payer: Self-pay | Admitting: Urology

## 2022-05-27 DIAGNOSIS — R399 Unspecified symptoms and signs involving the genitourinary system: Secondary | ICD-10-CM

## 2022-06-06 ENCOUNTER — Other Ambulatory Visit: Payer: Self-pay | Admitting: Urology

## 2022-06-06 DIAGNOSIS — R399 Unspecified symptoms and signs involving the genitourinary system: Secondary | ICD-10-CM

## 2022-06-27 ENCOUNTER — Other Ambulatory Visit: Payer: Self-pay | Admitting: Urology

## 2022-06-27 DIAGNOSIS — R399 Unspecified symptoms and signs involving the genitourinary system: Secondary | ICD-10-CM

## 2022-12-25 ENCOUNTER — Encounter (INDEPENDENT_AMBULATORY_CARE_PROVIDER_SITE_OTHER): Payer: Self-pay | Admitting: *Deleted

## 2023-01-14 ENCOUNTER — Ambulatory Visit (INDEPENDENT_AMBULATORY_CARE_PROVIDER_SITE_OTHER): Payer: 59 | Admitting: Gastroenterology

## 2023-01-14 ENCOUNTER — Encounter (INDEPENDENT_AMBULATORY_CARE_PROVIDER_SITE_OTHER): Payer: Self-pay | Admitting: Gastroenterology

## 2023-01-14 VITALS — BP 95/60 | HR 80 | Temp 97.8°F | Ht 70.5 in | Wt 197.6 lb

## 2023-01-14 DIAGNOSIS — K5903 Drug induced constipation: Secondary | ICD-10-CM

## 2023-01-14 DIAGNOSIS — K5900 Constipation, unspecified: Secondary | ICD-10-CM

## 2023-01-14 DIAGNOSIS — Z1211 Encounter for screening for malignant neoplasm of colon: Secondary | ICD-10-CM | POA: Insufficient documentation

## 2023-01-14 DIAGNOSIS — Z800 Family history of malignant neoplasm of digestive organs: Secondary | ICD-10-CM | POA: Diagnosis not present

## 2023-01-14 MED ORDER — POLYETHYLENE GLYCOL 3350 17 G PO PACK: 17.0000 g | PACK | Freq: Two times a day (BID) | ORAL | 0 refills | Status: AC

## 2023-01-14 MED ORDER — PSYLLIUM 58.6 % PO PACK: 1.0000 | PACK | Freq: Two times a day (BID) | ORAL | 2 refills | Status: AC

## 2023-01-14 NOTE — Progress Notes (Signed)
Brian Frazier , M.D. Gastroenterology & Hepatology [facility] [address] [place], [place] [address] Primary Care Physician: Rebekah [place], NP [address] [place] [address] [place] [address]  Chief Complaint:  Constipation , Screening colonoscopy   History of Present Illness: Brian Frazier is a 82 y.o. 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  Patient is accompanied with son in the clinic.  Reports patient was having Bristol stool scale 1-2 bowel movement every 2 to 3 days.  Since starting lactulose and Linzess , he is now having more regular bowel movements Bristol stool scale 4-5 1-2 times daily The patient denies having any nausea, vomiting, fever, chills, hematochezia, melena, hematemesis, abdominal distention, abdominal pain, diarrhea, jaundice, pruritus or weight loss.  Last ION:GEXB Last Colonoscopy:none  Last hemoglobin 14.2 MCV 91 platelet 296 normal liver enzymes  FHx: Sister age 78 colon cancer Surgical: Cholecystectomy appendectomy  Hemoglobin from 05/2022 Past Medical History: Past Medical History:  Diagnosis Date   Alzheimer disease (HCC)    CHF (congestive heart failure) (HCC)    Hypertension     Past Surgical History: Past Surgical History:  Procedure Laterality Date   FEMUR SURGERY     metal rod right leg   REPLACEMENT TOTAL KNEE     right    Family History:No family history on file.  Social History: Social History   Tobacco Use  Smoking Status Every Day  Smokeless Tobacco Never   Social History   Substance and Sexual Activity  Alcohol Use Yes   Social History   Substance and Sexual Activity  Drug Use No    Allergies: Allergies  Allergen Reactions   Aspirin Anaphylaxis, Hives and Swelling    Medications: Current  Outpatient Medications  Medication Sig Dispense Refill   atorvastatin (LIPITOR) 10 MG tablet Take 10 mg by mouth at bedtime.     diclofenac Sodium (VOLTAREN) 1 % GEL Apply topically 4 (four) times daily.     escitalopram (LEXAPRO) 10 MG tablet Take 10 mg by mouth daily.     gabapentin (NEURONTIN) 100 MG capsule Take 1 capsule (100 mg total) by mouth 2 (two) times daily. 60 capsule 5   indomethacin (INDOCIN) 50 MG capsule Take 50 mg by mouth 2 (two) times daily with a meal.     lactulose (CHRONULAC) 10 GM/15ML solution 15 mls daily     LINZESS 72 MCG capsule Take 72 mcg by mouth every morning.     lisinopril-hydrochlorothiazide (PRINZIDE,ZESTORETIC) 10-12.5 MG per tablet Take 1 tablet by mouth at bedtime.     oxyCODONE (ROXICODONE) 15 MG immediate release tablet Take 15 mg by mouth every 4 (four) hours as needed for pain.     mirabegron ER (MYRBETRIQ) 50 MG TB24 tablet TAKE ONE TABLET ONCE DAILY (Patient not taking: Reported on 01/14/2023) 30 tablet 3   Rimegepant Sulfate (NURTEC) 75 MG TBDP Take by mouth. (Patient not taking: Reported on 01/14/2023)     tamsulosin (FLOMAX) 0.4 MG CAPS capsule Take 1 capsule (0.4 mg total) by mouth daily. (Patient not taking: Reported on 01/14/2023) 30 capsule 11   No current facility-administered medications for this visit.    Review of Systems: GENERAL: negative for malaise, night sweats HEENT: No changes in hearing or vision, no nose bleeds or other nasal problems. NECK: Negative for lumps, goiter, pain and significant neck  swelling RESPIRATORY: Negative for cough, wheezing CARDIOVASCULAR: Negative for chest pain, leg swelling, palpitations, orthopnea GI: SEE HPI MUSCULOSKELETAL: Negative for joint pain or swelling, back pain, and muscle pain. SKIN: Negative for lesions, rash HEMATOLOGY Negative for prolonged bleeding, bruising easily, and swollen nodes. ENDOCRINE: Negative for cold or heat intolerance, polyuria, polydipsia and goiter. NEURO: negative for  tremor, gait imbalance, syncope and seizures. The remainder of the review of systems is noncontributory.   Physical Exam: There were no vitals taken for this visit. GENERAL: The patient is AO x3, in no acute distress. HEENT: Head is normocephalic and atraumatic. EOMI are intact. Mouth is well hydrated and without lesions. NECK: Supple. No masses LUNGS: Clear to auscultation. No presence of rhonchi/wheezing/rales. Adequate chest expansion HEART: RRR, normal s1 and s2. ABDOMEN: Soft, nontender, no guarding, no peritoneal signs, and nondistended. BS +. No masses.  Imaging/Labs: as above      No data to display         No results found for: "IRON", "TIBC", "FERRITIN"  I personally reviewed and interpreted the available labs, imaging and endoscopic files.  Impression and Plan: Brian Frazier is a 82 y.o. 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  #Colon Cancer screening : High risk for CRC   The patient was counseled regarding the importance of colorectal cancer screening, The benefits of screening include early detection of colorectal cancer and precancerous polyps, which can improve treatment outcomes and reduce mortality. Risks associated with screening, particularly colonoscopy, include potential complications such as bleeding and perforation.  Patient has family history of colon cancer in sister at age 69 and never had screening before  Patient does not want to pursue colonoscopy at this time given advanced age and multiple comorbidities.  Patient and son does verbalize understanding that without colonoscopy I would not be able to rule out advanced neoplasia or cancer .  Patient given option to be scheduled directly for colonoscopy if in future he changes his mind  #Constipation  This could be drug-induced constipation  due to gabapentin and chronic opioids  Patient since starting lactulose and Linzess is having more regular bowel movements  Ensure adequate fluid intake: Aim for 8 glasses of water daily. Follow a high fiber diet: Include foods such as dates, prunes, pears, and kiwi. Take Miralax twice a day for the first week, then reduce to once daily thereafter. Use Metamucil twice a day. Continue Linzess May stop lactulose as it can lead to abdominal cramps  Consider checking TSH with next bloodwork    All questions were answered.      Brian Lawman, MD Gastroenterology and Hepatology [facility]   This chart has been completed using [facility] Dictation software, and while attempts have been made to ensure accuracy , certain words and phrases may not be transcribed as intended

## 2023-01-14 NOTE — Patient Instructions (Signed)
It was very nice to meet you today, as dicussed with will plan for the following :  Ensure adequate fluid intake: Aim for 8 glasses of water daily. Follow a high fiber diet: Include foods such as dates, prunes, pears, and kiwi. Take Miralax twice a day for the first week, then reduce to once daily thereafter. Use Metamucil twice a day.

## 2023-02-20 IMAGING — CT CT ABD-PEL WO/W CM
2 of 13 series · 14 of 46 positions shown, 18 images · IV contrast (agent unspecified)
Comparison: None Available.

CLINICAL DATA: Microscopic hematuria, urinary frequency,
right-sided flank pain, history of kidney stones

EXAM:
CT ABDOMEN AND PELVIS WITHOUT AND WITH CONTRAST
TECHNIQUE: Multidetector CT imaging of the abdomen and pelvis was performed
following the standard protocol before and following the bolus
administration of intravenous contrast.

[Series 5: coronal pre · coronal · non-contrast · 0.98mm/px · 2 of 118 slices shown]
[im 40/118  soft-tissue]
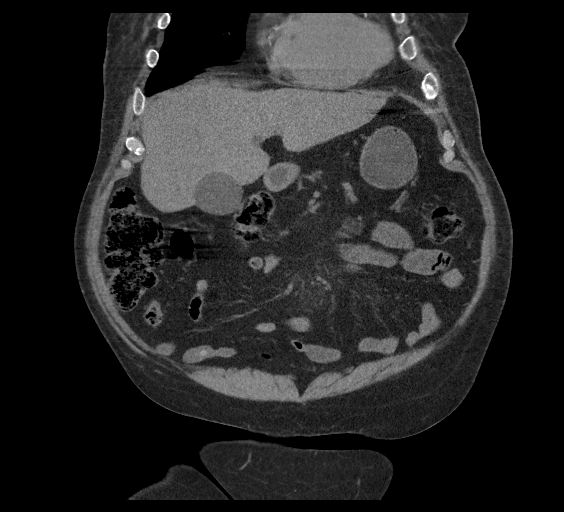
[im 79/118  soft-tissue]
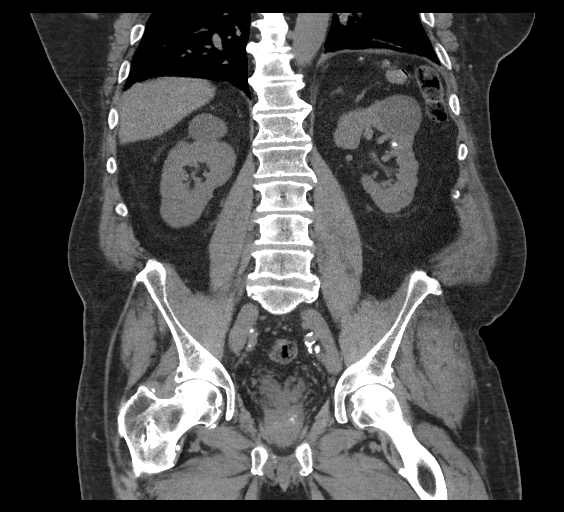

[Series 8: thins post · axial · 0.93mm/px · z∈[+976,+1408]mm · 12 of 975 slices shown, 16 images]
[im 55/975  soft-tissue]
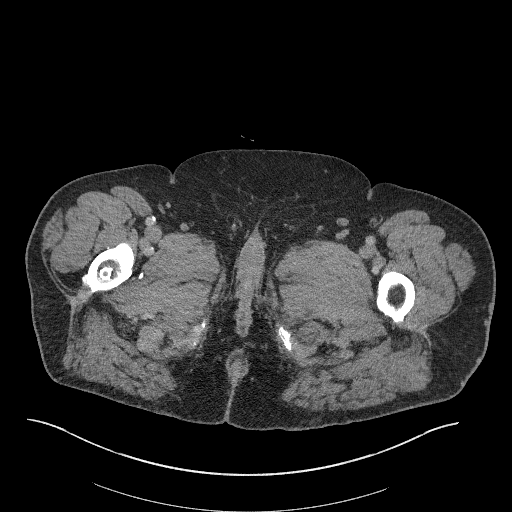
[im 55/975  bone]
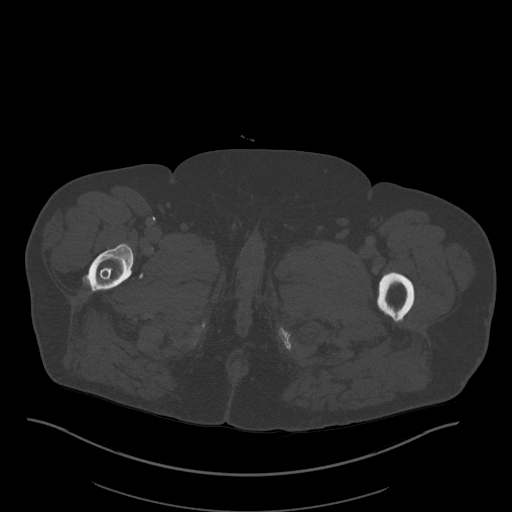
[im 163/975  soft-tissue]
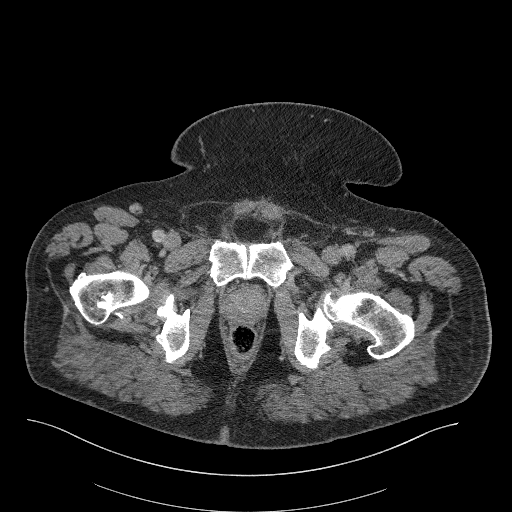
[im 271/975  soft-tissue]
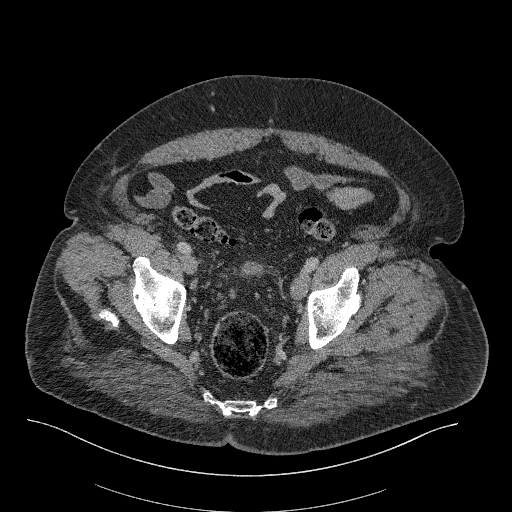
[im 325/975  soft-tissue]
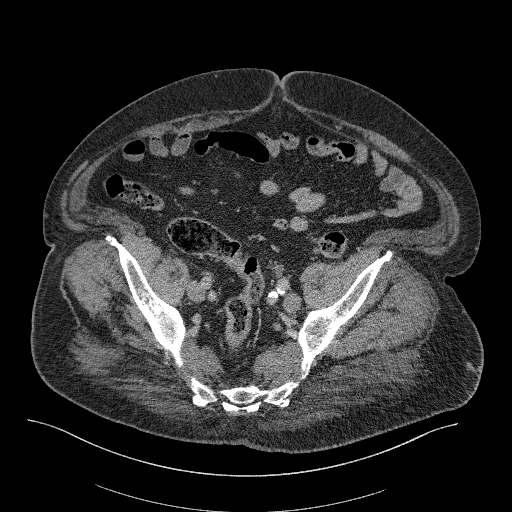
[im 433/975  soft-tissue]
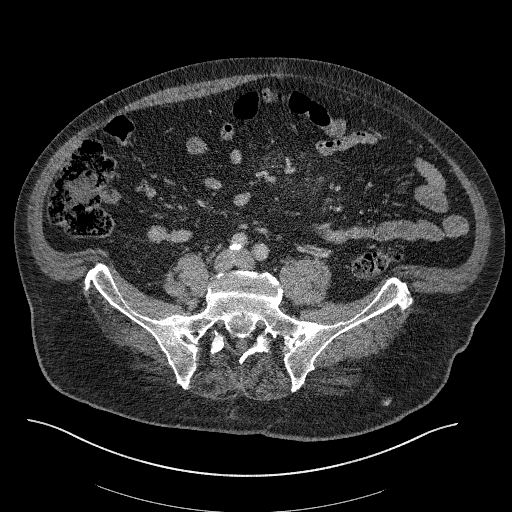
[im 542/975  soft-tissue]
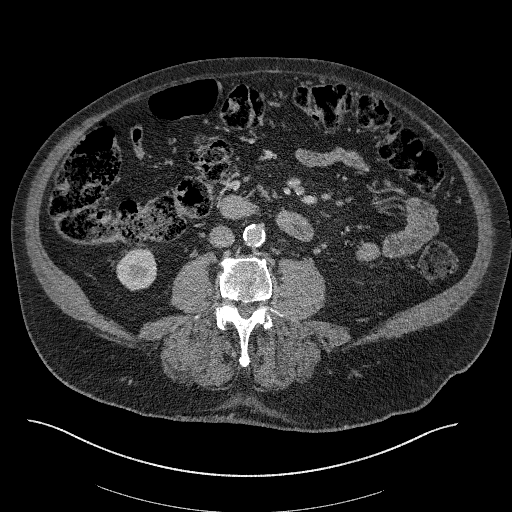
[im 650/975  soft-tissue]
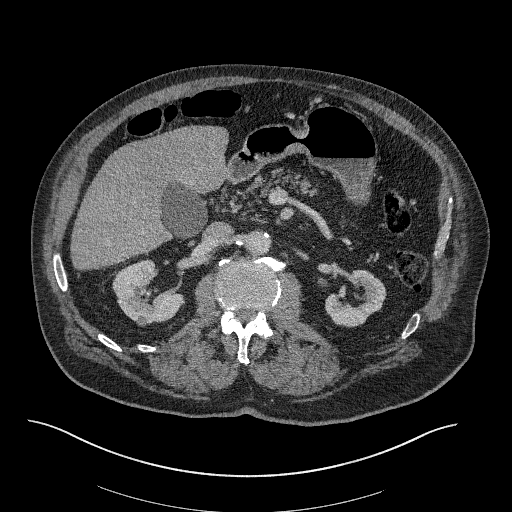
[im 704/975  soft-tissue]
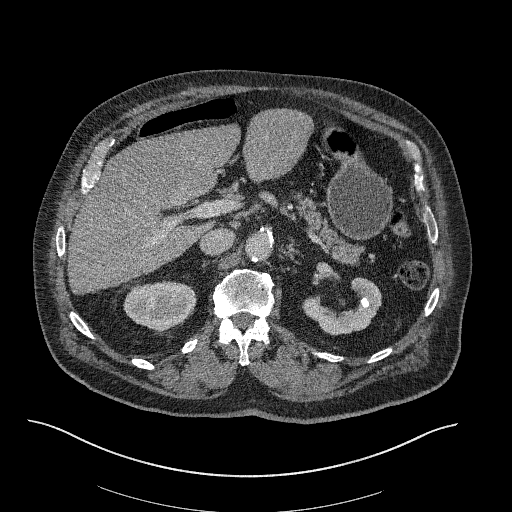
[im 758/975  lung]
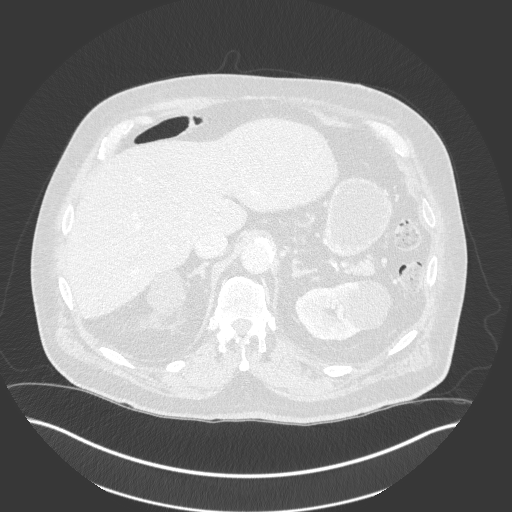
[im 812/975  soft-tissue]
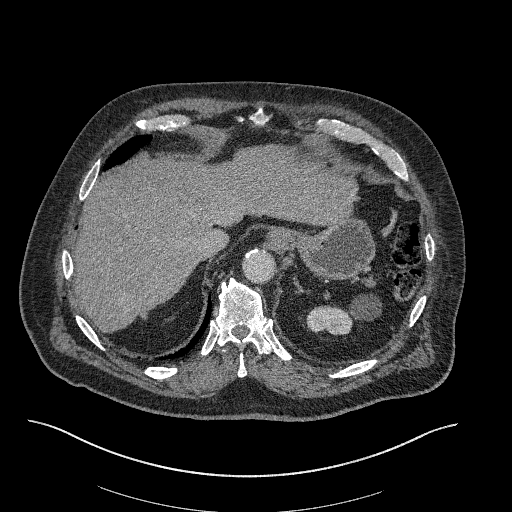
[im 812/975  lung]
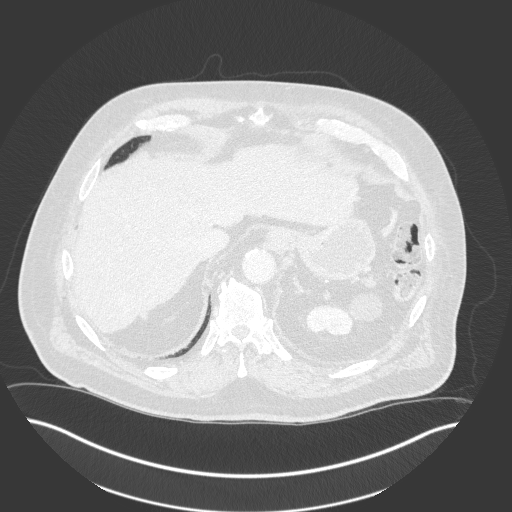
[im 812/975  bone]
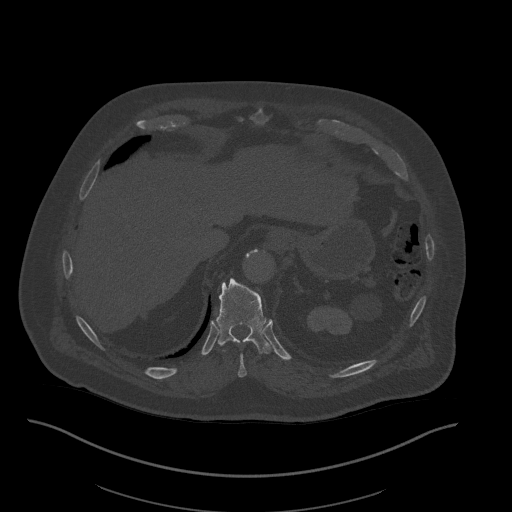
[im 866/975  lung]
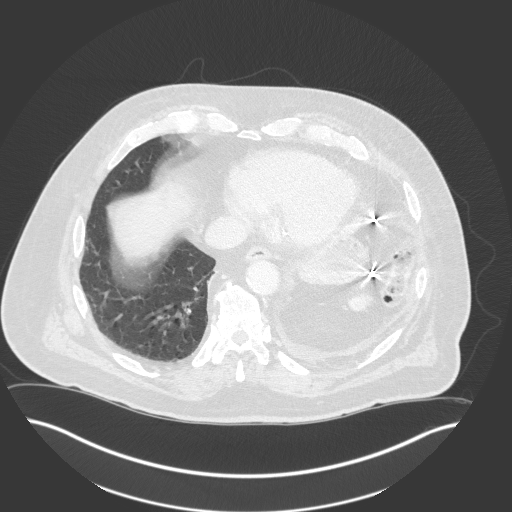
[im 920/975  soft-tissue]
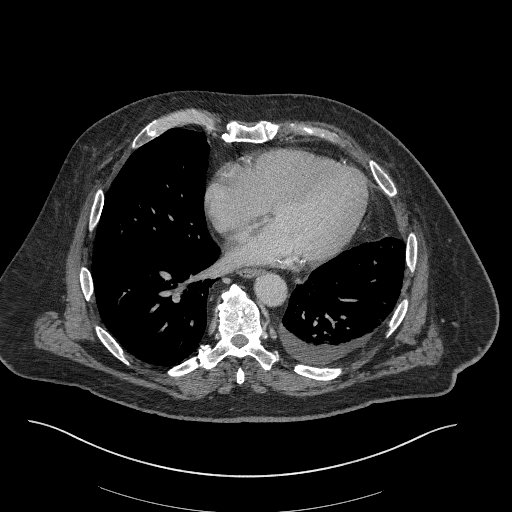
[im 920/975  lung]
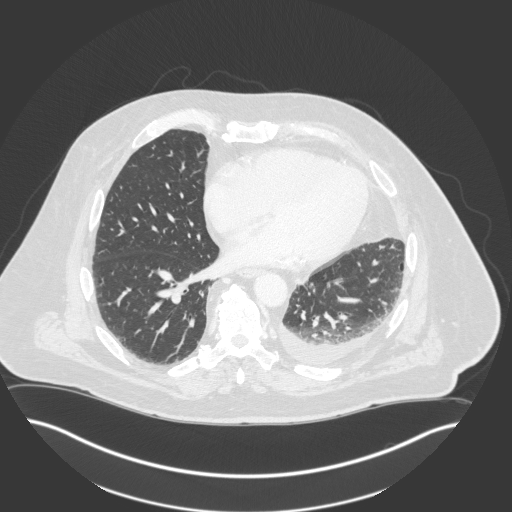

[14 of 46 positions shown; findings below may reference images not displayed]

RADIATION DOSE REDUCTION: This exam was performed according to the
departmental dose-optimization program which includes automated
exposure control, adjustment of the mA and/or kV according to
patient size and/or use of iterative reconstruction technique.

CONTRAST:  98 mL Omnipaque 350 iodinated contrast IV
FINDINGS: Lower chest: Small left pleural effusion and associated atelectasis
or consolidation. Aortic valve calcifications. Three-vessel coronary
artery calcifications.

Hepatobiliary: No solid liver abnormality is seen. Hepatic
steatosis. No gallstones, gallbladder wall thickening, or biliary
dilatation.

Pancreas: Unremarkable. No pancreatic ductal dilatation or
surrounding inflammatory changes.

Spleen: Status post splenectomy with hypertrophic splenule in the
left upper quadrant (series 2, image 13). Probable additional small
splenule in the hepatorenal recess (series 7, image 19).

Adrenals/Urinary Tract: Adrenal glands are unremarkable. Multiple
small bilateral nonobstructive renal calculi. No ureteral calculi or
hydronephrosis. Multiple simple, benign bilateral renal cysts, for
which no further follow-up or characterization is required. No
urinary tract filling defect on delayed phase imaging. Bladder is
decompressed although unremarkable in appearance.

Stomach/Bowel: Stomach is within normal limits. Appendix is not
clearly visualized. No evidence of bowel wall thickening,
distention, or inflammatory changes. Descending and sigmoid
diverticulosis. Extremely redundant sigmoid colon extending into the
right upper quadrant of the abdomen.

Vascular/Lymphatic: Aortic atherosclerosis. No enlarged abdominal or
pelvic lymph nodes.

Reproductive: No mass or other significant abnormality.

Other: No abdominal wall hernia or abnormality. No ascites.

Musculoskeletal: No acute or significant osseous findings.
IMPRESSION: 1. Nonobstructive bilateral nephrolithiasis. No ureteral calculi or
hydronephrosis.
2. No suspicious mass or contrast enhancement. No urinary tract
filling defect on delayed phase imaging.
3. Hepatic steatosis.
4. Descending and sigmoid diverticulosis without evidence of acute
diverticulitis.
5. Status post splenectomy with splenosis.
6. Small left pleural effusion and associated atelectasis or
consolidation.
7. Coronary artery disease.
8. Aortic valve calcifications. Correlate for echocardiographic
evidence of aortic valve dysfunction.

Aortic Atherosclerosis ([id number]).

## 2023-02-28 ENCOUNTER — Other Ambulatory Visit: Payer: Self-pay | Admitting: Urology

## 2023-02-28 DIAGNOSIS — N3941 Urge incontinence: Secondary | ICD-10-CM

## 2024-02-01 ENCOUNTER — Emergency Department (HOSPITAL_COMMUNITY)

## 2024-02-01 ENCOUNTER — Emergency Department (HOSPITAL_COMMUNITY)
Admission: EM | Admit: 2024-02-01 | Discharge: 2024-02-01 | Disposition: A | Discharge status: Home / Self Care | Attending: Emergency Medicine | Admitting: Emergency Medicine

## 2024-02-01 ENCOUNTER — Other Ambulatory Visit: Payer: Self-pay

## 2024-02-01 ENCOUNTER — Encounter (HOSPITAL_COMMUNITY): Payer: Self-pay | Admitting: *Deleted

## 2024-02-01 DIAGNOSIS — Y92122 Bedroom in nursing home as the place of occurrence of the external cause: Secondary | ICD-10-CM | POA: Diagnosis not present

## 2024-02-01 DIAGNOSIS — S51812A Laceration without foreign body of left forearm, initial encounter: Secondary | ICD-10-CM | POA: Diagnosis not present

## 2024-02-01 DIAGNOSIS — E041 Nontoxic single thyroid nodule: Secondary | ICD-10-CM | POA: Insufficient documentation

## 2024-02-01 DIAGNOSIS — N200 Calculus of kidney: Secondary | ICD-10-CM | POA: Diagnosis not present

## 2024-02-01 DIAGNOSIS — Z23 Encounter for immunization: Secondary | ICD-10-CM | POA: Insufficient documentation

## 2024-02-01 DIAGNOSIS — M791 Myalgia, unspecified site: Secondary | ICD-10-CM | POA: Diagnosis not present

## 2024-02-01 DIAGNOSIS — I509 Heart failure, unspecified: Secondary | ICD-10-CM | POA: Insufficient documentation

## 2024-02-01 DIAGNOSIS — Z79891 Long term (current) use of opiate analgesic: Secondary | ICD-10-CM | POA: Insufficient documentation

## 2024-02-01 DIAGNOSIS — F0390 Unspecified dementia without behavioral disturbance: Secondary | ICD-10-CM | POA: Diagnosis not present

## 2024-02-01 DIAGNOSIS — S0990XA Unspecified injury of head, initial encounter: Secondary | ICD-10-CM | POA: Diagnosis present

## 2024-02-01 DIAGNOSIS — R109 Unspecified abdominal pain: Secondary | ICD-10-CM | POA: Diagnosis present

## 2024-02-01 DIAGNOSIS — W19XXXA Unspecified fall, initial encounter: Secondary | ICD-10-CM | POA: Insufficient documentation

## 2024-02-01 DIAGNOSIS — I110 Hypertensive heart disease with heart failure: Secondary | ICD-10-CM | POA: Diagnosis not present

## 2024-02-01 LAB — URINALYSIS, ROUTINE W REFLEX MICROSCOPIC
Bacteria, UA: NONE SEEN
Bilirubin Urine: NEGATIVE
Glucose, UA: NEGATIVE mg/dL
Ketones, ur: NEGATIVE mg/dL
Leukocytes,Ua: NEGATIVE
Nitrite: NEGATIVE
Protein, ur: NEGATIVE mg/dL
Specific Gravity, Urine: 1.014 (ref 1.005–1.030)
pH: 7 (ref 5.0–8.0)

## 2024-02-01 LAB — CBC
HCT: 40.4 % (ref 39.0–52.0)
Hemoglobin: 12.6 g/dL — ABNORMAL LOW (ref 13.0–17.0)
MCH: 31.1 pg (ref 26.0–34.0)
MCHC: 31.2 g/dL (ref 30.0–36.0)
MCV: 99.8 fL (ref 80.0–100.0)
Platelets: 272 K/uL (ref 150–400)
RBC: 4.05 MIL/uL — ABNORMAL LOW (ref 4.22–5.81)
RDW: 13.5 % (ref 11.5–15.5)
WBC: 11.7 K/uL — ABNORMAL HIGH (ref 4.0–10.5)
nRBC: 0 % (ref 0.0–0.2)

## 2024-02-01 LAB — MAGNESIUM: Magnesium: 2.5 mg/dL — ABNORMAL HIGH (ref 1.7–2.4)

## 2024-02-01 LAB — COMPREHENSIVE METABOLIC PANEL WITH GFR
ALT: 18 U/L (ref 0–44)
AST: 24 U/L (ref 15–41)
Albumin: 3.5 g/dL (ref 3.5–5.0)
Alkaline Phosphatase: 82 U/L (ref 38–126)
Anion gap: 10 (ref 5–15)
BUN: 15 mg/dL (ref 8–23)
CO2: 28 mmol/L (ref 22–32)
Calcium: 8.9 mg/dL (ref 8.9–10.3)
Chloride: 105 mmol/L (ref 98–111)
Creatinine, Ser: 0.75 mg/dL (ref 0.61–1.24)
GFR, Estimated: >60 mL/min
Glucose, Bld: 90 mg/dL (ref 70–99)
Potassium: 4.2 mmol/L (ref 3.5–5.1)
Sodium: 142 mmol/L (ref 135–145)
Total Bilirubin: 0.4 mg/dL (ref 0.0–1.2)
Total Protein: 6.3 g/dL — ABNORMAL LOW (ref 6.5–8.1)

## 2024-02-01 LAB — I-STAT CHEM 8, ED
BUN: 16 mg/dL (ref 8–23)
Calcium, Ion: 1.16 mmol/L (ref 1.15–1.40)
Chloride: 100 mmol/L (ref 98–111)
Creatinine, Ser: 0.9 mg/dL (ref 0.61–1.24)
Glucose, Bld: 92 mg/dL (ref 70–99)
HCT: 40 % (ref 39.0–52.0)
Hemoglobin: 13.6 g/dL (ref 13.0–17.0)
Potassium: 4.1 mmol/L (ref 3.5–5.1)
Sodium: 142 mmol/L (ref 135–145)
TCO2: 30 mmol/L (ref 22–32)

## 2024-02-01 LAB — ETHANOL: Alcohol, Ethyl (B): <15 mg/dL

## 2024-02-01 LAB — TROPONIN T, HIGH SENSITIVITY
Troponin T High Sensitivity: 20 ng/L — ABNORMAL HIGH (ref 0–19)
Troponin T High Sensitivity: 23 ng/L — ABNORMAL HIGH (ref 0–19)

## 2024-02-01 LAB — PROTIME-INR
INR: 1 (ref 0.8–1.2)
Prothrombin Time: 13.7 s (ref 11.4–15.2)

## 2024-02-01 LAB — CK: Total CK: 35 U/L — ABNORMAL LOW (ref 49–397)

## 2024-02-01 MED ORDER — OXYCODONE HCL 5 MG PO TABS
20.0000 mg | ORAL_TABLET | Freq: Once | ORAL | Status: AC
  Administered 2024-02-01: 20 mg via ORAL
  Filled 2024-02-01: qty 4

## 2024-02-01 MED ORDER — TETANUS-DIPHTH-ACELL PERTUSSIS 5-2-15.5 LF-MCG/0.5 IM SUSP
0.5000 mL | Freq: Once | INTRAMUSCULAR | Status: AC
  Administered 2024-02-01: 0.5 mL via INTRAMUSCULAR
  Filled 2024-02-01: qty 0.5

## 2024-02-01 MED ORDER — IOHEXOL 300 MG/ML  SOLN
100.0000 mL | Freq: Once | INTRAMUSCULAR | Status: AC | PRN
  Administered 2024-02-01: 100 mL via INTRAVENOUS

## 2024-02-01 MED ORDER — OXYCODONE HCL 5 MG PO TABS
15.0000 mg | ORAL_TABLET | Freq: Once | ORAL | Status: AC
  Administered 2024-02-01: 15 mg via ORAL
  Filled 2024-02-01: qty 3

## 2024-02-01 NOTE — ED Triage Notes (Signed)
 Pt BIB [facility] from [facility] for c/o fall  Pt states he was trying to get up to go to bathroom and when he reached for his walker he fell backwards  Pt has skin tear to left arm and left flank

## 2024-02-01 NOTE — Discharge Instructions (Signed)
 Your test results today were reassuring.  Your imaging studies did not show any major injuries.  Keep areas of left arm wounds clean and dry.  Continue with your regular pain medication for pain and soreness.  Return to the emergency department for any new or worsening symptoms of concern.

## 2024-02-01 NOTE — ED Notes (Signed)
Patient assisted with urinal at this time.  

## 2024-02-01 NOTE — ED Notes (Signed)
 Pt assisted with urinal Pull-up changed as well

## 2024-02-01 NOTE — ED Provider Notes (Signed)
 " [place] EMERGENCY DEPARTMENT AT [facility] Provider Note   CSN: [id number] Arrival date & time: 02/01/24  0001     Patient presents with: Brian Frazier is a 83 y.o. male.   HPI Patient presents after fall.  Medical history includes dementia, HTN, CHF.  He is not on a blood thinner.  This evening, he had an unwitnessed fall at his nursing facility.  He does recall the fall and states that he was reaching for his walker and lost his balance.  This caused him to fall backwards.  He describes diffuse pain which he states was present prior to his fall.  Per review of his nursing still to paperwork, he does get 20 mg oxycodone  every 8 hours.  He states that he is due for his pain medicine.  He arrives by EMS who noted skin tears to left arm.    Prior to Admission medications  Medication Sig Start Date End Date Taking? Authorizing Provider  atorvastatin (LIPITOR) 10 MG tablet Take 10 mg by mouth at bedtime. 09/28/13   [provider]  diclofenac Sodium (VOLTAREN) 1 % GEL Apply topically 4 (four) times daily.    [provider]  escitalopram (LEXAPRO) 10 MG tablet Take 10 mg by mouth daily.    [provider]  gabapentin  (NEURONTIN ) 100 MG capsule Take 1 capsule (100 mg total) by mouth 2 (two) times daily. 08/21/20   Skeet Juliene SAUNDERS, DO  indomethacin (INDOCIN) 50 MG capsule Take 50 mg by mouth 2 (two) times daily with a meal.    [provider]  lactulose (CHRONULAC) 10 GM/15ML solution 15 mls daily 12/20/22   [provider]  LINZESS 72 MCG capsule Take 72 mcg by mouth every morning. 12/09/22   [provider]  lisinopril-hydrochlorothiazide (PRINZIDE,ZESTORETIC) 10-12.5 MG per tablet Take 1 tablet by mouth at bedtime. 08/23/13   [provider]  mirabegron  ER (MYRBETRIQ ) 50 MG TB24 tablet TAKE ONE TABLET ONCE DAILY Patient not taking: Reported on 01/14/2023 02/28/22   Stoneking, Adine PARAS., MD  oxyCODONE  (ROXICODONE )  15 MG immediate release tablet Take 15 mg by mouth every 4 (four) hours as needed for pain.    [provider]  Rimegepant Sulfate (NURTEC) 75 MG TBDP Take by mouth. Patient not taking: Reported on 01/14/2023    [provider]  tamsulosin  (FLOMAX ) 0.4 MG CAPS capsule Take 1 capsule (0.4 mg total) by mouth daily. Patient not taking: Reported on 01/14/2023 06/07/21   Roseann Adine PARAS., MD    Allergies: Aspirin    Review of Systems  Gastrointestinal:  Positive for abdominal pain.  Musculoskeletal:  Positive for arthralgias and myalgias.  Skin:  Positive for wound.  All other systems reviewed and are negative.   Updated Vital Signs BP 134/60   Pulse 84   Temp 97.7 F (36.5 C) (Oral)   Resp 18   SpO2 95%   Physical Exam Vitals and nursing note reviewed.  Constitutional:      General: He is not in acute distress.    Appearance: Normal appearance. He is well-developed. He is not ill-appearing, toxic-appearing or diaphoretic.  HENT:     Head: Normocephalic and atraumatic.     Right Ear: External ear normal.     Left Ear: External ear normal.     Nose: Nose normal.     Mouth/Throat:     Mouth: Mucous membranes are moist.  Eyes:     Extraocular Movements: Extraocular movements intact.  Conjunctiva/sclera: Conjunctivae normal.  Cardiovascular:     Rate and Rhythm: Normal rate and regular rhythm.  Pulmonary:     Effort: Pulmonary effort is normal. No respiratory distress.  Chest:     Chest wall: No tenderness.  Abdominal:     Palpations: Abdomen is soft.     Tenderness: There is abdominal tenderness. There is no guarding or rebound.  Musculoskeletal:        General: Tenderness present. No swelling or deformity.     Cervical back: Normal range of motion and neck supple.  Skin:    General: Skin is warm and dry.     Coloration: Skin is not jaundiced or pale.  Neurological:     General: No focal deficit present.     Mental Status: He is alert.      Cranial Nerves: No cranial nerve deficit.     Sensory: No sensory deficit.     Motor: No weakness.     Coordination: Coordination normal.  Psychiatric:        Mood and Affect: Mood normal.        Behavior: Behavior normal.     (all labs ordered are listed, but only abnormal results are displayed) Labs Reviewed  COMPREHENSIVE METABOLIC PANEL WITH GFR - Abnormal; Notable for the following components:      Result Value   Total Protein 6.3 (*)    All other components within normal limits  CBC - Abnormal; Notable for the following components:   WBC 11.7 (*)    RBC 4.05 (*)    Hemoglobin 12.6 (*)    All other components within normal limits  URINALYSIS, ROUTINE W REFLEX MICROSCOPIC - Abnormal; Notable for the following components:   Hgb urine dipstick SMALL (*)    All other components within normal limits  CK - Abnormal; Notable for the following components:   Total CK 35 (*)    All other components within normal limits  MAGNESIUM - Abnormal; Notable for the following components:   Magnesium 2.5 (*)    All other components within normal limits  TROPONIN T, HIGH SENSITIVITY - Abnormal; Notable for the following components:   Troponin T High Sensitivity 20 (*)    All other components within normal limits  TROPONIN T, HIGH SENSITIVITY - Abnormal; Notable for the following components:   Troponin T High Sensitivity 23 (*)    All other components within normal limits  ETHANOL  PROTIME-INR  I-STAT CHEM 8, ED    EKG: EKG Interpretation Date/Time:  Sunday February 01 2024 00:51:04 EST Ventricular Rate:  61 PR Interval:  156 QRS Duration:  148 QT Interval:  467 QTC Calculation: 471 R Axis:   -5  Text Interpretation: Sinus rhythm Right bundle branch block Left ventricular hypertrophy Confirmed by Melvenia Motto ([id number]) on 02/01/2024 3:35:05 AM  Radiology: CT CERVICAL SPINE WO CONTRAST Result Date: 02/01/2024 EXAM: CT CERVICAL SPINE WITHOUT CONTRAST 02/01/2024 02:35:38 AM TECHNIQUE:  CT of the cervical spine was performed without the administration of intravenous contrast. Multiplanar reformatted images are provided for review. Automated exposure control, iterative reconstruction, and/or weight based adjustment of the mA/kV was utilized to reduce the radiation dose to as low as reasonably achievable. COMPARISON: None available. CLINICAL HISTORY: Polytrauma, blunt. FINDINGS: BONES AND ALIGNMENT: No acute fracture or traumatic malalignment. DEGENERATIVE CHANGES: Multilevel age-related spondylosis. Disc space height loss is greatest at C4-C5 where it is moderate. Calcification of the posterior longitudinal ligament from C2-C6 causes mild effacement of the ventral thecal sac. No  severe spinal canal narrowing. SOFT TISSUES: 2.6 cm left thyroid nodule. No prevertebral soft tissue swelling. IMPRESSION: 1. No acute cervical spine findings. 2. 2.6 cm left thyroid nodule; recommend non-emergent thyroid ultrasound. Electronically signed by: Norman Gatlin MD 02/01/2024 02:57 AM EST RP Workstation: [id number]   CT HEAD WO CONTRAST Result Date: 02/01/2024 EXAM: CT HEAD WITHOUT CONTRAST 02/01/2024 02:35:38 AM TECHNIQUE: CT of the head was performed without the administration of intravenous contrast. Automated exposure control, iterative reconstruction, and/or weight based adjustment of the mA/kV was utilized to reduce the radiation dose to as low as reasonably achievable. COMPARISON: MRI of 7 / 13 / 22 . CLINICAL HISTORY: Head trauma, moderate-severe. FINDINGS: BRAIN AND VENTRICLES: No acute hemorrhage. No evidence of acute infarct. No hydrocephalus. No extra-axial collection. No mass effect or midline shift. Age-related atrophy. Moderate periventricular and deep cerebral white matter disease. ORBITS: Status post bilateral lens replacement. SINUSES: No acute abnormality. SOFT TISSUES AND SKULL: No acute soft tissue abnormality. No skull fracture. VASCULATURE: Moderate calcific atheromatous disease within  carotid siphons and vertebral arteries. IMPRESSION: 1. No acute intracranial abnormality. Electronically signed by: Norman Gatlin MD 02/01/2024 02:53 AM EST RP Workstation: [id number]   CT CHEST ABDOMEN PELVIS W CONTRAST Result Date: 02/01/2024 EXAM: CT CHEST, ABDOMEN AND PELVIS WITH CONTRAST 02/01/2024 02:35:38 AM TECHNIQUE: CT of the chest, abdomen and pelvis was performed with the administration of 100 mL of intravenous iohexol  (OMNIPAQUE ) 300 MG/ML solution. Multiplanar reformatted images are provided for review. Automated exposure control, iterative reconstruction, and/or weight based adjustment of the mA/kV was utilized to reduce the radiation dose to as low as reasonably achievable. COMPARISON: None available. CLINICAL HISTORY: Recent fall with chest and back pain, initial encounter. FINDINGS: CHEST: MEDIASTINUM AND LYMPH NODES: Heart and pericardium are unremarkable. Aortic calcifications of the thoracic aorta are noted. No aneurysmal dilatation is seen. Coronary calcifications are noted. The pulmonary artery as visualized is within normal limits of evaluation. The central airways are clear. Thoracic inlet is within normal limits. No hilar or mediastinal adenopathy is noted. The esophagus, as visualized, is within normal limits. LUNGS AND PLEURA: Mild bibasilar atelectasis is seen. No focal consolidation or pulmonary edema. No pleural effusion or pneumothorax. BONES AND SOFT TISSUES: The bony structures show degenerative change of the THORACIC SPINE. No acute osseous abnormality. No focal soft tissue abnormality. ABDOMEN AND PELVIS: LIVER: The liver is unremarkable. GALLBLADDER AND BILE DUCTS: Gallbladder is unremarkable. No biliary ductal dilatation. SPLEEN: He has had a splenectomy. Small splenules are noted in the surgical bed. PANCREAS: No acute abnormality. ADRENAL GLANDS: No acute abnormality. KIDNEYS, URETERS AND BLADDER: Nonobstructing renal calculi are noted bilaterally. These measure up to 12  mm on the right and 13 mm on the left. No obstructive changes are seen. No hydronephrosis. The bladder is decompressed. GI AND BOWEL: Stomach demonstrates no acute abnormality. Scattered fecal material is noted throughout the colon without evidence of obstructive change. The appendix is not well visualized. No inflammatory changes to suggest appendicitis are noted. There is no bowel obstruction. REPRODUCTIVE ORGANS: The prostate is within normal limits. No acute abnormality. PERITONEUM AND RETROPERITONEUM: No ascites. No free air. VASCULATURE: Aortic calcifications are seen without aneurysmal dilatation. ABDOMINAL AND PELVIS LYMPH NODES: No lymphadenopathy. BONES AND SOFT TISSUES: Postsurgical changes are noted in the proximal right femur. Degenerative changes of the LUMBAR SPINE are noted. No acute osseous abnormality. No focal soft tissue abnormality. IMPRESSION: 1. No acute findings in the chest, abdomen, and pelvis related to the recent fall. 2. Nonobstructing  renal calculi bilaterally, measuring up to 12 mm on the right and 13 mm on the left, without obstructive changes. Electronically signed by: Oneil Devonshire MD 02/01/2024 02:52 AM EST RP Workstation: [id number]     Procedures   Medications Ordered in the ED  Tdap (ADACEL ) injection 0.5 mL (0.5 mLs Intramuscular Given 02/01/24 0107)  oxyCODONE  (Oxy IR/ROXICODONE ) immediate release tablet 20 mg (20 mg Oral Given 02/01/24 0108)  iohexol  (OMNIPAQUE ) 300 MG/ML solution 100 mL (100 mLs Intravenous Contrast Given 02/01/24 0234)                                    Medical Decision Making Amount and/or Complexity of Data Reviewed Labs: ordered. Radiology: ordered.  Risk Prescription drug management.   This patient presents to the ED for concern of fall, this involves an extensive number of treatment options, and is a complaint that carries with it a high risk of complications and morbidity.  The differential diagnosis includes acute  injuries   Co morbidities / Chronic conditions that complicate the patient evaluation  dementia, HTN, CHF   Additional history obtained:  Additional history obtained from EMR External records from outside source obtained and reviewed including N/A   Lab Tests:  I Ordered, and personally interpreted labs.  The pertinent results include: Normal hemoglobin, mild leukocytosis, normal kidney function, normal electrolytes.  Very slightly elevated troponin with no significant increase on repeat.   Imaging Studies ordered:  I ordered imaging studies including CT of head, cervical spine, chest, abdomen, pelvis I independently visualized and interpreted imaging which showed no acute findings I agree with the radiologist interpretation   Cardiac Monitoring: / EKG:  The patient was maintained on a cardiac monitor.  I personally viewed and interpreted the cardiac monitored which showed an underlying rhythm of: Sinus rhythm   Problem List / ED Course / Critical interventions / Medication management  Patient presenting for mechanical fall.  He describes diffuse pain that was present prior to the fall.  He denies any particular locations of worsened pain.  He is noted to have left arm skin tears.  He does not have any left arm deformities.  It is unclear if he struck his head.  He is not on a blood thinner.  Exam is notable for left-sided abdominal tenderness.  He does have good range of motion in all extremities.  He does describe worsened pain with lower extremity range of motion.  Patient's home dose of oxycodone  was ordered.  Workup was initiated.  Lab work and imaging studies were reassuring.  Skin tears on left arm were dressed with Xeroform and gauze.  Patient was discharged in stable condition. I ordered medication including oxycodone  for analgesia Reevaluation of the patient after these medicines showed that the patient improved I have reviewed the patients home medicines and have made  adjustments as needed  Social Determinants of Health:  Resides in nursing facility     Final diagnoses:  Fall, initial encounter    ED Discharge Orders     None          Melvenia Motto, MD 02/01/24 [phone]  "
